# Patient Record
Sex: Male | Born: 1980
Health system: Southern US, Community
[De-identification: ages and names within clinical notes are randomized; demographics above are authoritative.]

## PROBLEM LIST (undated history)

## (undated) DIAGNOSIS — E119 Type 2 diabetes mellitus without complications: Secondary | ICD-10-CM

---

## 2005-09-16 ENCOUNTER — Emergency Department (HOSPITAL_COMMUNITY): Admission: EM | Admit: 2005-09-16 | Discharge: 2005-09-16 | Payer: Self-pay | Admitting: Emergency Medicine

## 2010-01-07 ENCOUNTER — Emergency Department (HOSPITAL_COMMUNITY)
Admission: EM | Admit: 2010-01-07 | Discharge: 2010-01-07 | Payer: Self-pay | Source: Home / Self Care | Admitting: Family Medicine

## 2010-02-23 ENCOUNTER — Emergency Department (HOSPITAL_COMMUNITY)
Admission: EM | Admit: 2010-02-23 | Discharge: 2010-02-23 | Payer: Self-pay | Source: Home / Self Care | Admitting: Emergency Medicine

## 2010-03-08 ENCOUNTER — Emergency Department (HOSPITAL_COMMUNITY)
Admission: EM | Admit: 2010-03-08 | Discharge: 2010-03-08 | Disposition: A | Discharge status: Home / Self Care | Payer: Self-pay | Attending: Emergency Medicine | Admitting: Emergency Medicine

## 2010-03-08 DIAGNOSIS — H5789 Other specified disorders of eye and adnexa: Secondary | ICD-10-CM | POA: Insufficient documentation

## 2010-03-08 DIAGNOSIS — H11419 Vascular abnormalities of conjunctiva, unspecified eye: Secondary | ICD-10-CM | POA: Insufficient documentation

## 2010-03-08 DIAGNOSIS — R220 Localized swelling, mass and lump, head: Secondary | ICD-10-CM | POA: Insufficient documentation

## 2010-03-08 DIAGNOSIS — H0019 Chalazion unspecified eye, unspecified eyelid: Secondary | ICD-10-CM | POA: Insufficient documentation

## 2010-03-08 DIAGNOSIS — K122 Cellulitis and abscess of mouth: Secondary | ICD-10-CM | POA: Insufficient documentation

## 2010-03-08 DIAGNOSIS — R51 Headache: Secondary | ICD-10-CM | POA: Insufficient documentation

## 2010-03-08 DIAGNOSIS — R221 Localized swelling, mass and lump, neck: Secondary | ICD-10-CM | POA: Insufficient documentation

## 2010-04-10 LAB — CULTURE, ROUTINE-ABSCESS

## 2010-09-02 ENCOUNTER — Emergency Department (HOSPITAL_COMMUNITY)
Admission: EM | Admit: 2010-09-02 | Discharge: 2010-09-02 | Disposition: A | Discharge status: Home / Self Care | Payer: Self-pay | Attending: Emergency Medicine | Admitting: Emergency Medicine

## 2010-09-02 DIAGNOSIS — H00019 Hordeolum externum unspecified eye, unspecified eyelid: Secondary | ICD-10-CM | POA: Insufficient documentation

## 2010-09-02 DIAGNOSIS — F17200 Nicotine dependence, unspecified, uncomplicated: Secondary | ICD-10-CM | POA: Insufficient documentation

## 2010-09-02 DIAGNOSIS — H5710 Ocular pain, unspecified eye: Secondary | ICD-10-CM | POA: Insufficient documentation

## 2010-09-04 LAB — WOUND CULTURE

## 2010-12-29 ENCOUNTER — Encounter: Payer: Self-pay | Admitting: *Deleted

## 2010-12-29 ENCOUNTER — Emergency Department (HOSPITAL_COMMUNITY)
Admission: EM | Admit: 2010-12-29 | Discharge: 2010-12-29 | Disposition: A | Discharge status: Home / Self Care | Payer: Self-pay | Attending: Emergency Medicine | Admitting: Emergency Medicine

## 2010-12-29 DIAGNOSIS — R220 Localized swelling, mass and lump, head: Secondary | ICD-10-CM | POA: Insufficient documentation

## 2010-12-29 DIAGNOSIS — K089 Disorder of teeth and supporting structures, unspecified: Secondary | ICD-10-CM | POA: Insufficient documentation

## 2010-12-29 DIAGNOSIS — K047 Periapical abscess without sinus: Secondary | ICD-10-CM | POA: Insufficient documentation

## 2010-12-29 DIAGNOSIS — K029 Dental caries, unspecified: Secondary | ICD-10-CM | POA: Insufficient documentation

## 2010-12-29 MED ORDER — OXYCODONE-ACETAMINOPHEN 5-325 MG PO TABS
1.0000 | ORAL_TABLET | Freq: Once | ORAL | Status: AC
  Administered 2010-12-29: 1 via ORAL
  Filled 2010-12-29: qty 1

## 2010-12-29 MED ORDER — OXYCODONE-ACETAMINOPHEN 5-325 MG PO TABS: 1.0000 | ORAL_TABLET | Freq: Four times a day (QID) | ORAL | Status: AC | PRN

## 2010-12-29 MED ORDER — PENICILLIN V POTASSIUM 500 MG PO TABS: 500.0000 mg | ORAL_TABLET | Freq: Four times a day (QID) | ORAL | Status: AC

## 2010-12-29 NOTE — ED Notes (Signed)
Pt has knot on the inside of his mouth.

## 2010-12-29 NOTE — ED Provider Notes (Signed)
History     CSN: [id number] Arrival date & time: 12/29/2010  2:07 PM   First MD Initiated Contact with Patient 12/29/10 1625      Chief Complaint  Patient presents with  . Dental Pain    (Consider location/radiation/quality/duration/timing/severity/associated sxs/prior treatment) Patient is a 30 y.o. male presenting with tooth pain. The history is provided by the patient.  Dental PainThe primary symptoms include mouth pain. Primary symptoms do not include dental injury, fever, shortness of breath, angioedema or cough. The symptoms began 3 to 5 days ago. The symptoms are worsening. The symptoms are new. The symptoms occur constantly.  Additional symptoms include: dental sensitivity to temperature, gum swelling, gum tenderness and facial swelling. Additional symptoms do not include: trismus, dry mouth and nosebleeds. Medical issues include: smoking.    History reviewed. No pertinent past medical history.  History reviewed. No pertinent past surgical history.  History reviewed. No pertinent family history.  History  Substance Use Topics  . Smoking status: Current Everyday Smoker -- 1.0 packs/day  . Smokeless tobacco: Not on file  . Alcohol Use: Yes      Review of Systems  Constitutional: Negative for fever.  HENT: Positive for facial swelling. Negative for nosebleeds.   Respiratory: Negative for cough and shortness of breath.   All other systems reviewed and are negative.    Allergies  Review of patient's allergies indicates no known allergies.  Home Medications   Current Outpatient Rx  Name Route Sig Dispense Refill  . GOODYS BODY PAIN PO Oral Take 1 packet by mouth every 6 (six) hours as needed. pain     . PRESCRIPTION MEDICATION Oral Take 1 tablet by mouth 4 (four) times daily as needed. Pain. Unknown medication doesn't remember what pharmacy he had it filled with.  Knows it has Tylenol in it.  Thanks Pharmacy       BP 124/92  Pulse 99  Temp(Src) 98.4 F (36.9  C) (Oral)  Resp 19  SpO2 98%  Physical Exam  Nursing note and vitals reviewed. Constitutional: He is oriented to person, place, and time. He appears well-developed and well-nourished. He appears distressed.  HENT:  Head: Normocephalic and atraumatic. No trismus in the jaw.  Mouth/Throat: Dental abscesses and dental caries present. No uvula swelling.         Left lower jaw swelling  Eyes: EOM are normal. Pupils are equal, round, and reactive to light.  Neck: Normal range of motion. Neck supple.  Neurological: He is alert and oriented to person, place, and time.  Skin: Skin is warm and dry.    ED Course  Procedures (including critical care time)  Labs Reviewed - No data to display No results found.  INCISION AND DRAINAGE Performed by: Gwyneth Sprout Consent: Verbal consent obtained. Risks and benefits: risks, benefits and alternatives were discussed Type: abscess  Body area: Mouth  Anesthesia: local infiltration  Local anesthetic: Bupivacaine 0.5%   Anesthetic total: 2 ml  Complexity: Simple   Drainage: purulent  Drainage amount: 2 mL   Patient tolerance: Patient tolerated the procedure well with no immediate complications.     No diagnosis found.    MDM   Pt with dental caries and facial swelling. Dental abscess present. No signs of ludwig's angina or difficulty swallowing and no systemic symptoms.  Area known and a moderate amount of pus drained. Will treat with PCN and have pt f/u with dentist.         Gwyneth Sprout, MD 12/29/10 1745

## 2012-05-15 ENCOUNTER — Emergency Department (HOSPITAL_COMMUNITY)
Admission: EM | Admit: 2012-05-15 | Discharge: 2012-05-15 | Disposition: A | Discharge status: Home / Self Care | Payer: Self-pay | Attending: Emergency Medicine | Admitting: Emergency Medicine

## 2012-05-15 DIAGNOSIS — K029 Dental caries, unspecified: Secondary | ICD-10-CM | POA: Insufficient documentation

## 2012-05-15 DIAGNOSIS — F17200 Nicotine dependence, unspecified, uncomplicated: Secondary | ICD-10-CM | POA: Insufficient documentation

## 2012-05-15 DIAGNOSIS — R220 Localized swelling, mass and lump, head: Secondary | ICD-10-CM | POA: Insufficient documentation

## 2012-05-15 DIAGNOSIS — K047 Periapical abscess without sinus: Secondary | ICD-10-CM

## 2012-05-15 MED ORDER — CLINDAMYCIN HCL 150 MG PO CAPS: 150.0000 mg | ORAL_CAPSULE | Freq: Four times a day (QID) | ORAL | Status: DC

## 2012-05-15 MED ORDER — HYDROCODONE-ACETAMINOPHEN 5-325 MG PO TABS: 1.0000 | ORAL_TABLET | ORAL | Status: DC | PRN

## 2012-05-15 NOTE — ED Provider Notes (Signed)
History     CSN: [id number]  Arrival date & time 05/15/12  0902   First MD Initiated Contact with Patient 05/15/12 [phone]      Chief Complaint  Patient presents with  . Dental Pain    (Consider location/radiation/quality/duration/timing/severity/associated sxs/prior treatment) HPI  32 year old male presents complaining of dental pain. Pain is located to left lower jaw for the past 2 days, 10/10, nonradiating, sharp and throbbing, persistent, with associate facial swelling. States he has several missing teeth to the affected area but that is due to dental decay.  Denies fever, chills, ear pain, sore throat, neck pain, trouble breathing, chest pain shortness of breath, or rash. He denies any recent trauma. He has tried taking over-the-counter medication including Tylenol, aspirin, warm compress with minimal relief. Does not have a dentist.  No past medical history on file.  No past surgical history on file.  No family history on file.  History  Substance Use Topics  . Smoking status: Current Every Day Smoker -- 1.00 packs/day  . Smokeless tobacco: Not on file  . Alcohol Use: Yes      Review of Systems  Constitutional:       A complete 10 system review of systems was obtained and all systems are negative except as noted in the HPI and PMH.    Allergies  Review of patient's allergies indicates no known allergies.  Home Medications   Current Outpatient Rx  Name  Route  Sig  Dispense  Refill  . BAYER ASPIRIN PO   Oral   Take 2 capsules by mouth daily as needed (for pain.).           There were no vitals taken for this visit.  Physical Exam  Nursing note and vitals reviewed. Constitutional: He appears well-developed and well-nourished. No distress.  HENT:  Head: Atraumatic.  Right Ear: External ear normal.  Left Ear: External ear normal.  Mouth/Throat: Oropharynx is clear and moist.    Eyes: Conjunctivae are normal.  Neck: Normal range of motion. Neck supple.   Lymphadenopathy:    He has no cervical adenopathy.  Neurological: He is alert.  Skin: Skin is warm. No rash noted.    ED Course  Procedures (including critical care time)  9:31 AM Patient with significant dental decay and associated gingival swelling suggestive of periapical abscess not amenable to drainage in the ED. No obvious trauma no trismus no other concerning factor.  Labs Reviewed - No data to display No results found.   1. Periapical abscess with facial involvement       MDM  BP 153/97  Pulse 76  Temp(Src) 98.1 F (36.7 C) (Oral)  Resp 20  SpO2 97%  I have reviewed nursing notes and vital signs.  I reviewed available ER/hospitalization records thought the EMR         Fayrene Helper, [place] 05/16/12 8657

## 2012-05-15 NOTE — ED Notes (Signed)
Pt has left lower dental and gum pain.  Pt reports 10/10 pain and swelling to lower jaw.  RN notes redness and swelling. Pt is missing teeth on left rear of jaw, states they wore down and he hasnt seen a dentist.  Pt alert oriented X4

## 2012-05-19 NOTE — ED Provider Notes (Signed)
Medical screening examination/treatment/procedure(s) were performed by non-physician practitioner and as supervising physician I was immediately available for consultation/collaboration.    Virgle Arth L Zahari Xiang, MD 05/19/12 0708 

## 2013-08-13 ENCOUNTER — Encounter (HOSPITAL_COMMUNITY): Payer: Self-pay | Admitting: Emergency Medicine

## 2013-08-13 ENCOUNTER — Inpatient Hospital Stay (HOSPITAL_COMMUNITY)
Admission: AD | Admit: 2013-08-13 | Discharge: 2013-08-20 | DRG: 885 | Disposition: A | Discharge status: Home / Self Care | Payer: No Typology Code available for payment source | Source: Intra-hospital | Attending: Psychiatry | Admitting: Psychiatry

## 2013-08-13 ENCOUNTER — Encounter (HOSPITAL_COMMUNITY): Payer: Self-pay | Admitting: *Deleted

## 2013-08-13 ENCOUNTER — Emergency Department (HOSPITAL_COMMUNITY)
Admission: EM | Admit: 2013-08-13 | Discharge: 2013-08-13 | Disposition: A | Discharge status: Other Acute Inpatient Hospital | Payer: Federal, State, Local not specified - Other | Attending: Emergency Medicine | Admitting: Emergency Medicine

## 2013-08-13 DIAGNOSIS — E119 Type 2 diabetes mellitus without complications: Secondary | ICD-10-CM | POA: Diagnosis present

## 2013-08-13 DIAGNOSIS — Z598 Other problems related to housing and economic circumstances: Secondary | ICD-10-CM | POA: Diagnosis not present

## 2013-08-13 DIAGNOSIS — Z5987 Material hardship due to limited financial resources, not elsewhere classified: Secondary | ICD-10-CM

## 2013-08-13 DIAGNOSIS — R45851 Suicidal ideations: Secondary | ICD-10-CM

## 2013-08-13 DIAGNOSIS — Z5989 Other problems related to housing and economic circumstances: Secondary | ICD-10-CM | POA: Diagnosis not present

## 2013-08-13 DIAGNOSIS — F32A Depression, unspecified: Secondary | ICD-10-CM

## 2013-08-13 DIAGNOSIS — Z609 Problem related to social environment, unspecified: Secondary | ICD-10-CM

## 2013-08-13 DIAGNOSIS — F329 Major depressive disorder, single episode, unspecified: Secondary | ICD-10-CM | POA: Insufficient documentation

## 2013-08-13 DIAGNOSIS — F17200 Nicotine dependence, unspecified, uncomplicated: Secondary | ICD-10-CM | POA: Diagnosis present

## 2013-08-13 DIAGNOSIS — F332 Major depressive disorder, recurrent severe without psychotic features: Secondary | ICD-10-CM | POA: Diagnosis present

## 2013-08-13 DIAGNOSIS — F3289 Other specified depressive episodes: Secondary | ICD-10-CM | POA: Insufficient documentation

## 2013-08-13 DIAGNOSIS — F411 Generalized anxiety disorder: Secondary | ICD-10-CM | POA: Diagnosis present

## 2013-08-13 DIAGNOSIS — IMO0001 Reserved for inherently not codable concepts without codable children: Secondary | ICD-10-CM

## 2013-08-13 DIAGNOSIS — Z833 Family history of diabetes mellitus: Secondary | ICD-10-CM

## 2013-08-13 DIAGNOSIS — Z7982 Long term (current) use of aspirin: Secondary | ICD-10-CM | POA: Insufficient documentation

## 2013-08-13 DIAGNOSIS — E1165 Type 2 diabetes mellitus with hyperglycemia: Secondary | ICD-10-CM

## 2013-08-13 DIAGNOSIS — R739 Hyperglycemia, unspecified: Secondary | ICD-10-CM

## 2013-08-13 HISTORY — DX: Suicidal ideations: R45.851

## 2013-08-13 HISTORY — DX: Major depressive disorder, recurrent severe without psychotic features: F33.2

## 2013-08-13 LAB — SALICYLATE LEVEL: Salicylate Lvl: <2 mg/dL — ABNORMAL LOW (ref 2.8–20.0)

## 2013-08-13 LAB — COMPREHENSIVE METABOLIC PANEL
ALBUMIN: 3.8 g/dL (ref 3.5–5.2)
ALT: 11 U/L (ref 0–53)
AST: 17 U/L (ref 0–37)
Alkaline Phosphatase: 102 U/L (ref 39–117)
Anion gap: 12 (ref 5–15)
BILIRUBIN TOTAL: 0.3 mg/dL (ref 0.3–1.2)
BUN: 13 mg/dL (ref 6–23)
CHLORIDE: 97 meq/L (ref 96–112)
CO2: 28 meq/L (ref 19–32)
CREATININE: 0.77 mg/dL (ref 0.50–1.35)
Calcium: 9.5 mg/dL (ref 8.4–10.5)
GFR calc Af Amer: >90 mL/min (ref >90)
Glucose, Bld: 271 mg/dL — ABNORMAL HIGH (ref 70–99)
POTASSIUM: 4.4 meq/L (ref 3.7–5.3)
SODIUM: 137 meq/L (ref 137–147)
Total Protein: 7.7 g/dL (ref 6.0–8.3)

## 2013-08-13 LAB — CBC
HCT: 42.9 % (ref 39.0–52.0)
Hemoglobin: 14.9 g/dL (ref 13.0–17.0)
MCH: 30.7 pg (ref 26.0–34.0)
MCHC: 34.7 g/dL (ref 30.0–36.0)
MCV: 88.3 fL (ref 78.0–100.0)
PLATELETS: 222 10*3/uL (ref 150–400)
RBC: 4.86 MIL/uL (ref 4.22–5.81)
RDW: 12.3 % (ref 11.5–15.5)
WBC: 5.7 10*3/uL (ref 4.0–10.5)

## 2013-08-13 LAB — RAPID URINE DRUG SCREEN, HOSP PERFORMED
Amphetamines: NOT DETECTED
BARBITURATES: NOT DETECTED
BENZODIAZEPINES: NOT DETECTED
COCAINE: NOT DETECTED
OPIATES: NOT DETECTED
TETRAHYDROCANNABINOL: NOT DETECTED

## 2013-08-13 LAB — ACETAMINOPHEN LEVEL: Acetaminophen (Tylenol), Serum: <15 ug/mL (ref 10–30)

## 2013-08-13 LAB — GLUCOSE, CAPILLARY: Glucose-Capillary: 295 mg/dL — ABNORMAL HIGH (ref 70–99)

## 2013-08-13 LAB — ETHANOL

## 2013-08-13 MED ORDER — ACETAMINOPHEN 325 MG PO TABS
650.0000 mg | ORAL_TABLET | Freq: Four times a day (QID) | ORAL | Status: DC | PRN
  Administered 2013-08-15: 650 mg via ORAL

## 2013-08-13 MED ORDER — LORAZEPAM 1 MG PO TABS: 1.0000 mg | ORAL_TABLET | Freq: Three times a day (TID) | ORAL | Status: DC | PRN

## 2013-08-13 MED ORDER — TRAZODONE HCL 100 MG PO TABS
100.0000 mg | ORAL_TABLET | Freq: Every evening | ORAL | Status: DC | PRN
  Administered 2013-08-13 – 2013-08-18 (×4): 100 mg via ORAL
  Filled 2013-08-13: qty 14
  Filled 2013-08-13 (×6): qty 1

## 2013-08-13 MED ORDER — ALUM & MAG HYDROXIDE-SIMETH 200-200-20 MG/5ML PO SUSP: 30.0000 mL | ORAL | Status: DC | PRN

## 2013-08-13 MED ORDER — INSULIN ASPART 100 UNIT/ML ~~LOC~~ SOLN
0.0000 [IU] | Freq: Three times a day (TID) | SUBCUTANEOUS | Status: DC
  Administered 2013-08-13: 8 [IU] via SUBCUTANEOUS
  Administered 2013-08-14: 3 [IU] via SUBCUTANEOUS
  Administered 2013-08-14: 5 [IU] via SUBCUTANEOUS
  Administered 2013-08-14: 3 [IU] via SUBCUTANEOUS
  Administered 2013-08-15: 2 [IU] via SUBCUTANEOUS
  Administered 2013-08-15: 5 [IU] via SUBCUTANEOUS
  Administered 2013-08-15 – 2013-08-16 (×2): 3 [IU] via SUBCUTANEOUS
  Administered 2013-08-16: 2 [IU] via SUBCUTANEOUS
  Administered 2013-08-16 – 2013-08-17 (×2): 3 [IU] via SUBCUTANEOUS
  Administered 2013-08-17: 2 [IU] via SUBCUTANEOUS
  Administered 2013-08-17: 3 [IU] via SUBCUTANEOUS
  Administered 2013-08-18: 2 [IU] via SUBCUTANEOUS
  Administered 2013-08-18: 5 [IU] via SUBCUTANEOUS
  Administered 2013-08-19: 2 [IU] via SUBCUTANEOUS
  Administered 2013-08-19: 3 [IU] via SUBCUTANEOUS
  Administered 2013-08-20 (×2): 2 [IU] via SUBCUTANEOUS

## 2013-08-13 MED ORDER — MAGNESIUM HYDROXIDE 400 MG/5ML PO SUSP: 30.0000 mL | Freq: Every day | ORAL | Status: DC | PRN

## 2013-08-13 MED ORDER — ZOLPIDEM TARTRATE 5 MG PO TABS: 5.0000 mg | ORAL_TABLET | Freq: Every evening | ORAL | Status: DC | PRN

## 2013-08-13 MED ORDER — ACETAMINOPHEN 325 MG PO TABS
650.0000 mg | ORAL_TABLET | ORAL | Status: DC | PRN
  Filled 2013-08-13: qty 2

## 2013-08-13 MED ORDER — INSULIN ASPART 100 UNIT/ML ~~LOC~~ SOLN: 0.0000 [IU] | Freq: Three times a day (TID) | SUBCUTANEOUS | Status: DC

## 2013-08-13 MED ORDER — IBUPROFEN 600 MG PO TABS: 600.0000 mg | ORAL_TABLET | Freq: Three times a day (TID) | ORAL | Status: DC | PRN

## 2013-08-13 MED ORDER — SERTRALINE HCL 25 MG PO TABS
25.0000 mg | ORAL_TABLET | Freq: Every day | ORAL | Status: DC
  Administered 2013-08-13 – 2013-08-14 (×2): 25 mg via ORAL
  Filled 2013-08-13 (×5): qty 1

## 2013-08-13 MED ORDER — IBUPROFEN 200 MG PO TABS
600.0000 mg | ORAL_TABLET | Freq: Three times a day (TID) | ORAL | Status: DC | PRN
Start: 2013-08-13 — End: 2013-08-13

## 2013-08-13 MED ORDER — ACETAMINOPHEN 325 MG PO TABS: 650.0000 mg | ORAL_TABLET | ORAL | Status: DC | PRN

## 2013-08-13 MED ORDER — HALOPERIDOL LACTATE 5 MG/ML IJ SOLN
INTRAMUSCULAR | Status: AC
  Filled 2013-08-13: qty 1

## 2013-08-13 MED ORDER — ASPIRIN 81 MG PO CHEW
81.0000 mg | CHEWABLE_TABLET | Freq: Every day | ORAL | Status: DC
  Administered 2013-08-13 – 2013-08-20 (×8): 81 mg via ORAL
  Filled 2013-08-13 (×11): qty 1

## 2013-08-13 MED ORDER — ONDANSETRON HCL 4 MG PO TABS: 4.0000 mg | ORAL_TABLET | Freq: Three times a day (TID) | ORAL | Status: DC | PRN

## 2013-08-13 NOTE — ED Notes (Signed)
Per pt, has hx of depression.  [facility]  Pt did not take meds.  Lives with family.  Driving today with uncle and made statements of hurting self.  No plan.  Denies attempt.

## 2013-08-13 NOTE — ED Provider Notes (Signed)
Medical screening examination/treatment/procedure(s) were performed by non-physician practitioner and as supervising physician I was immediately available for consultation/collaboration.    Linwood DibblesJon Seriyah Collison, MD 08/13/13 [phone]

## 2013-08-13 NOTE — Progress Notes (Signed)
Patient ID: Lawrence Mccormick, male   DOB: 06/01/1980, 33 y.o.   MRN: [id number] Nursing Admit    [facility] this 33 yearold african [place] male, single, denies substance abuse, who is endorsing fleeting SI, states he has " come here for help" and pretty much this is all he will divulge to this Clinical research associatewriter. HE denies known drug and / or food allergies, PMH, previous hospitalization. Says he " lives in the area", is single, " does odd jobs" . When pt's search is completed, ankle bracelt for house arrest discovered by this writer and he states he' s had it on " awhile". He reprots having increased feeling sof depression recently and that last night he " had a hard time" because " I wanted to kill myslef and couldn't think of a reason to keep living". At this point, when pressed by this writer, this patient does agree not to hurt himslef and to communicate with this nurse if his feelings change. After admission assessment is completed, he is oriented to unit and served his dinner. POC initiated.

## 2013-08-13 NOTE — ED Notes (Signed)
[facility]. Patient currently denies SI/HI and auditory and visual hallucinations but admits to having "on and off thoughts" about harming himself. Patient has no active plan and verbally agrees to contract for safety. Patient given meal and drink. Patient has no questions or concerns at this time. Will continue to monitor patient for safety.

## 2013-08-13 NOTE — Progress Notes (Signed)
D:  Passive SI-contracts for safety.Pt denies HI/AVH. Pt is pleasant and cooperative. Pt forwards little, but stated he  was in prison from 1999-2005 for felony kidnapping, but pt would not go into details. Pt stated he went back in Dec 2005 for f months. Pt was vague on why he was wearing the ankle bracelet, but sounded like a possible probation violation, but pt has had the bracelet for 2 months.   A: Pt was offered support and encouragement. Pt was given scheduled medications. Pt was encourage to attend groups. Q 15 minute checks were done for safety.   R:Pt attends groups and interacts well with peers and staff. Pt is taking medication. Pt has no complaints at this time.Pt receptive to treatment and safety maintained on unit.

## 2013-08-13 NOTE — Progress Notes (Signed)
Psychoeducational Group Note  Date:  08/13/2013 Time:  2103  Group Topic/Focus:  Wrap-Up Group:   The focus of this group is to help patients review their daily goal of treatment and discuss progress on daily workbooks.  Participation Level: Did Not Attend  Participation Quality:  Not Applicable  Affect:  Not Applicable  Cognitive:  Not Applicable  Insight:  Not Applicable  Engagement in Group: Not Applicable  Additional Comments:  The patient did not attend group since he was either in the hallway or in his bedroom at that time.   [name], Jaxtin Raimondo S 08/13/2013, 9:04 PM
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
## 2013-08-13 NOTE — ED Provider Notes (Signed)
CSN: [id number]     Arrival date & time 08/13/13  1303 History  This chart was scribed for Lawrence Beck, PA, working with Linwood Dibbles, MD by Chestine Spore, ED Scribe. The patient was seen in room WTR5/WTR5 at 1:50 PM.     Chief Complaint  Patient presents with  . Depression  . Suicidal   HPI HPI Comments: Lawrence Mccormick is a 33 y.o. male who presents to the Emergency Department complaining of depression and suicidal. He states that he has an h/o depression. He states that he was given a Rx when he was in [facility] for depression. He states that he did not take the medications that were prescribed for him. He states that he was driving with his uncle today and he made statements of hurting hisself. He states that he does not currently have a plan for killing hisself. He denies any attempt made at this time.   He denies any other associated symptoms. He states that he lives with his family. He states that he feels as if life is not going his way. He states that daily stress is what makes him feel this way. He denies wanting to hurt anyone else. He states that he just does not want to be around anymore. He states that he does not do drugs, but he does drink beer once a week.   History reviewed. No pertinent past medical history. History reviewed. No pertinent past surgical history. History reviewed. No pertinent family history. History  Substance Use Topics  . Smoking status: Current Every Day Smoker -- 1.00 packs/day  . Smokeless tobacco: Not on file  . Alcohol Use: Yes     Comment: occ    Review of Systems  Psychiatric/Behavioral: Positive for suicidal ideas and dysphoric mood.  All other systems reviewed and are negative.     Allergies  Review of patient's allergies indicates no known allergies.  Home Medications   Prior to Admission medications   Medication Sig Start Date End Date Taking? Authorizing Provider  aspirin 81 MG tablet Take 162 mg by mouth daily as needed for pain.    Yes Historical Provider, MD   BP 123/75  Pulse 68  Temp(Src) 98.4 F (36.9 C) (Oral)  Resp 16  SpO2 99%  Physical Exam  Nursing note and vitals reviewed. Constitutional: He is oriented to person, place, and time. He appears well-developed and well-nourished. No distress.  HENT:  Head: Normocephalic and atraumatic.  Eyes: EOM are normal.  Neck: Neck supple. No tracheal deviation present.  Cardiovascular: Normal rate.   Pulmonary/Chest: Effort normal. No respiratory distress.  Musculoskeletal: Normal range of motion.  Neurological: He is alert and oriented to person, place, and time.  Skin: Skin is warm and dry.  Psychiatric:  Flat affect. Depressed mood.     ED Course  Procedures (including critical care time) DIAGNOSTIC STUDIES: Oxygen Saturation is 99% on room air, normal by my interpretation.    COORDINATION OF CARE: 1:53 PM-Discussed treatment plan which includes labs with pt at bedside and pt agreed to plan.   Labs Review Labs Reviewed  CBC  URINE RAPID DRUG SCREEN (HOSP PERFORMED)  COMPREHENSIVE METABOLIC PANEL  ETHANOL  ACETAMINOPHEN LEVEL  SALICYLATE LEVEL    Imaging Review No results found.   EKG Interpretation None      MDM   Final diagnoses:  Suicidal ideation  Depression    1:59 PM Labs pending. Vitals stable and patient.  I personally performed the services described in this  documentation, which was scribed in my presence. The recorded information has been reviewed and is accurate.    Lawrence BeckKaitlyn Bracy Pepper, PA-C 08/13/13 [address]1430  Roark Rufo, PA-C 08/13/13 1430

## 2013-08-13 NOTE — ED Notes (Signed)
Patient belongings, shoes, shirt, pants, wallet, cigaretss, gum and mints

## 2013-08-13 NOTE — Tx Team (Signed)
Initial Interdisciplinary Treatment Plan  PATIENT STRENGTHS: (choose at least two) Ability for insight Active sense of humor  PATIENT STRESSORS: Educational concerns Financial difficulties Health problems Legal issue Marital or family conflict   PROBLEM LIST: Problem List/Patient Goals Date to be addressed Date deferred Reason deferred Estimated date of resolution  Suiciadal Ideations 08/13/13     Depression due to SI 08/13/2013     Lack of family 08/13/2013                                          DISCHARGE CRITERIA:  Ability to meet basic life and health needs Adequate post-discharge living arrangements Improved stabilization in mood, thinking, and/or behavior  PRELIMINARY DISCHARGE PLAN: Attend aftercare/continuing care group Outpatient therapy Participate in family therapy  PATIENT/FAMIILY INVOLVEMENT: This treatment plan has been presented to and reviewed with the patient, Lawrence Mccormick, and/or family member,   The patient and family have been given the opportunity to ask questions and make suggestions.  Rich BraveDuke, Kortney Schoenfelder Lynn 08/13/2013, 7:10 PM

## 2013-08-13 NOTE — ED Notes (Signed)
[facility] Patient given education regarding transfer. Patient has no questions or concerns at this time. Patient states that "I am ready to receive help for my depression." The patient was given encouragement from RN before discharge. [facility]

## 2013-08-13 NOTE — Progress Notes (Signed)
[facility] provided pt with a [facility] [employer] Card application to help patient establish a pcp.  °

## 2013-08-13 NOTE — Progress Notes (Signed)
CSW was made aware by med tech in the ED that the patient had an ankle bracelet like someone on home monitoring on his ankle. She noticed it as the patient was walking out of the building and there were no notes to make anyone staff aware of the potential legal concerns.  CSW called and [facility] to make them aware and inform the social worker to investigate.      Maryelizabeth Rowanressa Issabela Lesko, MSW, [place], 08/13/2013 Evening Clinical Social Worker [phone]

## 2013-08-14 DIAGNOSIS — R45851 Suicidal ideations: Secondary | ICD-10-CM

## 2013-08-14 DIAGNOSIS — R7309 Other abnormal glucose: Secondary | ICD-10-CM

## 2013-08-14 DIAGNOSIS — R739 Hyperglycemia, unspecified: Secondary | ICD-10-CM | POA: Diagnosis present

## 2013-08-14 DIAGNOSIS — F332 Major depressive disorder, recurrent severe without psychotic features: Principal | ICD-10-CM

## 2013-08-14 LAB — URINALYSIS, ROUTINE W REFLEX MICROSCOPIC
Bilirubin Urine: NEGATIVE
GLUCOSE, UA: NEGATIVE mg/dL
Ketones, ur: NEGATIVE mg/dL
LEUKOCYTES UA: NEGATIVE
NITRITE: NEGATIVE
Protein, ur: 30 mg/dL — AB
Specific Gravity, Urine: 1.028 (ref 1.005–1.030)
Urobilinogen, UA: 0.2 mg/dL (ref 0.0–1.0)
pH: 6 (ref 5.0–8.0)

## 2013-08-14 LAB — BASIC METABOLIC PANEL
Anion gap: 11 (ref 5–15)
BUN: 15 mg/dL (ref 6–23)
CHLORIDE: 100 meq/L (ref 96–112)
CO2: 29 mEq/L (ref 19–32)
Calcium: 9.6 mg/dL (ref 8.4–10.5)
Creatinine, Ser: 1.06 mg/dL (ref 0.50–1.35)
GFR calc Af Amer: >90 mL/min (ref >90)
GFR calc non Af Amer: >90 mL/min (ref >90)
GLUCOSE: 202 mg/dL — AB (ref 70–99)
POTASSIUM: 4.5 meq/L (ref 3.7–5.3)
Sodium: 140 mEq/L (ref 137–147)

## 2013-08-14 LAB — GLUCOSE, CAPILLARY
Glucose-Capillary: 191 mg/dL — ABNORMAL HIGH (ref 70–99)
Glucose-Capillary: 227 mg/dL — ABNORMAL HIGH (ref 70–99)
Glucose-Capillary: 179 mg/dL — ABNORMAL HIGH (ref 70–99)

## 2013-08-14 LAB — URINE MICROSCOPIC-ADD ON

## 2013-08-14 MED ORDER — METFORMIN HCL 500 MG PO TABS
500.0000 mg | ORAL_TABLET | Freq: Two times a day (BID) | ORAL | Status: DC
Start: 2013-08-14 — End: 2013-08-15
  Administered 2013-08-14: 500 mg via ORAL
  Filled 2013-08-14 (×3): qty 1

## 2013-08-14 MED ORDER — SERTRALINE HCL 50 MG PO TABS
50.0000 mg | ORAL_TABLET | Freq: Every day | ORAL | Status: DC
  Administered 2013-08-15 – 2013-08-18 (×4): 50 mg via ORAL
  Filled 2013-08-14 (×6): qty 1

## 2013-08-14 NOTE — Progress Notes (Signed)
Psychoeducational Group Note  Date:  09/07/2011 Time:  1100 Group Topic/Focus:  Identifying Needs:   The focus of this group is to help patients identify their personal needs that have been historically problematic and identify healthy behaviors to address their needs.  Participation Level: Minimal   Participation Quality:Minimal   Affect Flat  :Cognitive:  Low   Insight:  Poor  Engagement in Group:

## 2013-08-14 NOTE — Consult Note (Signed)
[facility] Medical Consultation  Angela Coxorian J Wattley ZOX:[id number] DOB: 03/15/1980 DOA: 08/13/2013 PCP: No Pcp   Requesting physician: Dr. Jama Flavorsobos Date of consultation: 08/14/2013 Reason for consultation: Hyperglycemia versus questionable new onset diabetes mellitus  Impression/Recommendations Principal Problem:   Severe recurrent major depression without psychotic features Active Problems:   Hyperglycemia   #1 hyperglycemia/ ?? New onset diabetes mellitus Patient noted to be hyperglycemic with blood sugars ranging from 191-295. Patient with no prior history of diabetes. Patient does endorse a family history of diabetes in his father and grandfather. Patient denies any polyuria, no polydipsia, no polyphagia and no recent weight loss. CBC which was done on admission was within normal limits. Comprehensive metabolic profile done did have a glucose of 271 otherwise was within normal limits. Will check a hemoglobin A1c, check a UA with cultures and sensitivities, check a EKG, check a fasting lipid panel. Patient's blood pressure seems to be well-controlled with systolic blood pressures less than 130s. Will place patient on metformin 500 mg twice daily. Continue sliding scale insulin. Consult with diabetic coordinator. Patient will need yearly eye examination done as outpatient. Patient will need to a PCP to followup with as an outpatient for further management.  #2 severe recurrent patient depressive disorder Per primary team.   I will followup again tomorrow. Please contact me if I can be of assistance in the meanwhile. Thank you for this consultation.  Chief Complaint: Recurrent major depressive disorder  HPI:  Patient is a 33 year old African American gentleman with history of major depressive disorder who was admitted to behavioral health for recurrent major depressive disorder. It was noted the patient was hyperglycemic with blood sugars ranging from 191-295. We were consulted for  further evaluation of hyperglycemia versus new onset diabetes mellitus. Patient denies any prior history of diabetes however does endorse that his father and grandfather both are diabetics. Patient denies any fevers, no chills, no nausea, no vomiting, no chest pain, no shortness of breath, no polydipsia, no polyphagia, no recent weight loss, no polyuria. Patient denies any dysuria. No diarrhea, no constipation, no weakness, no cough, no melena, no hematemesis, no hematochezia.  Review of Systems:  As per GI otherwise negative.  History reviewed. No pertinent past medical history. History reviewed. No pertinent past surgical history. Social History:  reports that he has been smoking Cigarettes.  He has a 3.75 pack-year smoking history. He does not have any smokeless tobacco history on file. He reports that he drinks alcohol. He reports that he does not use illicit drugs.  No Known Allergies History reviewed. No pertinent family history.  Prior to Admission medications   Medication Sig Start Date End Date Taking? Authorizing Provider  aspirin 81 MG tablet Take 162 mg by mouth daily as needed for pain.    Historical Provider, MD   Physical Exam: Blood pressure 107/74, pulse 87, temperature 97.7 F (36.5 C), temperature source Oral, resp. rate 18, height 5\' 5"  (1.651 m), weight 82.101 kg (181 lb), SpO2 98.00%. Filed Vitals:   08/14/13 0933  BP: 107/74  Pulse: 87  Temp:   Resp:      General:  Well-developed well-nourished no acute cardiopulmonary distress.  Eyes: Pupils equal round and reactive to light and accommodation. Extraocular movements intact.  ENT: Oropharynx is clear, no lesions, no exudates.  Neck: Supple no lymphadenopathy. No JVD.  Cardiovascular: Regular rate rhythm no murmurs rubs or gallops. No lower extremity edema.  Respiratory: Clear to auscultation bilaterally. No wheezes, no crackles, no rhonchi.  Abdomen:  Soft, nontender, nondistended, positive bowel sounds,  no rebound, no guarding.  Skin: No rashes or lesions seen  Musculoskeletal: 5/5 bilateral upper extremity strength. 5/5 bilateral lower extremity strength.  Psychiatric: Depressed mood. Fair judgment. Fair insight.  Neurologic: Alert and oriented x3. Cranial nerves II through XII are grossly intact. No focal deficits.  Labs on Admission:  Basic Metabolic Panel:  Recent Labs Lab 08/13/13 1339  NA 137  K 4.4  CL 97  CO2 28  GLUCOSE 271*  BUN 13  CREATININE 0.77  CALCIUM 9.5   Liver Function Tests:  Recent Labs Lab 08/13/13 1339  AST 17  ALT 11  ALKPHOS 102  BILITOT 0.3  PROT 7.7  ALBUMIN 3.8   No results found for this basename: LIPASE, AMYLASE,  in the last 168 hours No results found for this basename: AMMONIA,  in the last 168 hours CBC:  Recent Labs Lab 08/13/13 1339  WBC 5.7  HGB 14.9  HCT 42.9  MCV 88.3  PLT 222   Cardiac Enzymes: No results found for this basename: CKTOTAL, CKMB, CKMBINDEX, TROPONINI,  in the last 168 hours BNP: No components found with this basename: POCBNP,  CBG:  Recent Labs Lab 08/13/13 2046 08/14/13 0626 08/14/13 1202  GLUCAP 295* 191* 227*    Radiological Exams on Admission: No results found.  EKG: None  Time spent: 65 mins  [facility] MD [facility] Pager [phone]  If 7PM-7AM, please contact night-coverage [email or web address] Password [facility] 08/14/2013, 3:57 PM

## 2013-08-14 NOTE — Progress Notes (Signed)
[facility] Group Notes:  (Nursing/MHT/Case Management/Adjunct)  Date:  08/14/2013  Time:  8:42 PM  Type of Therapy:  Psychoeducational Skills  Participation Level:  Active  Participation Quality:  Appropriate  Affect:  Flat  Cognitive:  Appropriate  Insight:  Appropriate  Engagement in Group:  Lacking  Modes of Intervention:  Education  Summary of Progress/Problems: The patient described his day as having been "interesting". He indicated that he went outside for fresh air and that his day was "productive" overall. In addition, he also stated that he is working on his housing situation. As a theme for the day, his support system will consist of his family and uncle.   [name], Lemonte Al S 08/14/2013, 8:42 PM

## 2013-08-14 NOTE — [facility] Suicide Risk Assessment (Signed)
   Nursing information obtained from:  Patient Demographic factors:  Male;Divorced or widowed;Low socioeconomic status Current Mental Status:  Suicidal ideation indicated by patient Loss Factors:  Decrease in vocational status Historical Factors:    Risk Reduction Factors:  Living with another person, especially a relative Total Time spent with patient: 45 minutes  CLINICAL FACTORS:   Depression:   Severe  Psychiatric Specialty Exam: Physical Exam  ROS  Blood pressure 107/74, pulse 87, temperature 97.7 F (36.5 C), temperature source Oral, resp. rate 18, height 5\' 5"  (1.651 m), weight 82.101 kg (181 lb), SpO2 98.00%.Body mass index is 30.12 kg/(m^2).  See Admit Note MSE  COGNITIVE FEATURES THAT CONTRIBUTE TO RISK:  Closed-mindedness    SUICIDE RISK:   Moderate:  Frequent suicidal ideation with limited intensity, and duration, some specificity in terms of plans, no associated intent, good self-control, limited dysphoria/symptomatology, some risk factors present, and identifiable protective factors, including available and accessible social support.  PLAN OF CARE:Patient will be admitted to inpatient psychiatric unit for stabilization and safety. Will provide and encourage milieu participation. Provide medication management and maked adjustments as needed.  Will follow daily.    I certify that inpatient services furnished can reasonably be expected to improve the patient's condition.  COBOS, FERNANDO 08/14/2013, 2:33 PM

## 2013-08-14 NOTE — [facility] Group Notes (Signed)
[facility] Group Notes:  (Nursing/MHT/Case Management/Adjunct)  Date:  08/14/2013  Time:  11:18 AM  Type of Therapy:  Psychoeducational Skills  Participation Level:  Active  Participation Quality:  Appropriate  Affect:  Appropriate  Cognitive:  Appropriate  Insight:  Appropriate  Engagement in Group:  Engaged  Modes of Intervention:  Discussion  Summary of Progress/Problems: Pt did attend self inventory group, pt reported that he was negative HI, no AH/VH noted. Pt did report being positive SI, however was able to contract for safety. Pt rated his depression as a 4, and his helplessness/hopelessness as a 0.     Pt reported concerns about his medication and needing to be on something to help with depression and racing thoughts, pt advised that the doctor will be made aware.   Jacquelyne BalintForrest, Monna Crean Shanta 08/14/2013, 11:18 AM
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
## 2013-08-14 NOTE — Progress Notes (Addendum)
D Brees is seen sitting in the group room. HE is coloring and working in a coloring book he found. HE takes his medications as planned. HE avoids eye contact. HE says he is experiencing suicidal ideations, but he easily contracts  For safety when he is asked .   A He cont to be evasive and non committal, when answering questions about his past and  his family situation. He answers questions on his morning assessment and these are as follows: denies SI within the past 24 hrs, rates his depression and hopelessness and anxiety "7/5/4" and he is concerned about where he will live when he is DC'd.   R Safety is in place and poc moves forward Addendum: [facility]ist from [facility] over to assess pt and following ordered: EKG, diabetes consult , Glucophage 500 mg PO BiD, urinalysis and C & S and BMEP with HgbA1c this evening at 1930. Pt educated and stated understanding.

## 2013-08-14 NOTE — [facility] Group Notes (Signed)
[facility] Group Notes:  (Clinical Social Work)  08/14/2013   1:15-2:15PM  Summary of Progress/Problems:   The main focus of today's process group was for the patient to identify ways in which they have sabotaged their own mental health wellness/recovery.  Motivational interviewing and a handout were used to explore the benefits and costs of their self-sabotaging behavior as well as the benefits and costs of changing this behavior.  The Stages of Change were explained to the group using a handout, and patients identified where they are with regard to changing self-defeating behaviors.  The patient expressed little in group, and was eventually called out to see the doctor.  Type of Therapy:  Process Group  Participation Level:  Active  Participation Quality:  Attentive  Affect:  Blunted and Resistant  Cognitive:  Alert  Insight:  Limited  Engagement in Therapy:  Limited  Modes of Intervention:  Education, Motivational Interviewing   Ambrose MantleMareida Grossman-Orr, LCSW 08/14/2013, 4:00pm
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
## 2013-08-15 ENCOUNTER — Other Ambulatory Visit: Payer: Self-pay

## 2013-08-15 DIAGNOSIS — IMO0001 Reserved for inherently not codable concepts without codable children: Secondary | ICD-10-CM

## 2013-08-15 DIAGNOSIS — E1165 Type 2 diabetes mellitus with hyperglycemia: Secondary | ICD-10-CM

## 2013-08-15 LAB — HEMOGLOBIN A1C
Hgb A1c MFr Bld: 9.3 % — ABNORMAL HIGH (ref <5.7)
Mean Plasma Glucose: 220 mg/dL — ABNORMAL HIGH (ref <117)

## 2013-08-15 LAB — GLUCOSE, CAPILLARY
GLUCOSE-CAPILLARY: 143 mg/dL — AB (ref 70–99)
Glucose-Capillary: 183 mg/dL — ABNORMAL HIGH (ref 70–99)
Glucose-Capillary: 202 mg/dL — ABNORMAL HIGH (ref 70–99)
Glucose-Capillary: 179 mg/dL — ABNORMAL HIGH (ref 70–99)
Glucose-Capillary: 192 mg/dL — ABNORMAL HIGH (ref 70–99)

## 2013-08-15 LAB — LIPID PANEL
CHOL/HDL RATIO: 3.4 ratio
Cholesterol: 182 mg/dL (ref 0–200)
HDL: 54 mg/dL (ref >39)
LDL CALC: 94 mg/dL (ref 0–99)
TRIGLYCERIDES: 172 mg/dL — AB (ref <150)
VLDL: 34 mg/dL (ref 0–40)

## 2013-08-15 MED ORDER — METFORMIN HCL 500 MG PO TABS
1000.0000 mg | ORAL_TABLET | Freq: Two times a day (BID) | ORAL | Status: DC
  Administered 2013-08-15 – 2013-08-20 (×11): 1000 mg via ORAL
  Filled 2013-08-15 (×6): qty 2
  Filled 2013-08-15: qty 56
  Filled 2013-08-15: qty 2
  Filled 2013-08-15 (×2): qty 56
  Filled 2013-08-15 (×3): qty 2
  Filled 2013-08-15: qty 56
  Filled 2013-08-15 (×3): qty 2

## 2013-08-15 MED ORDER — METFORMIN HCL 500 MG PO TABS
ORAL_TABLET | ORAL | Status: AC
  Filled 2013-08-15: qty 1

## 2013-08-15 NOTE — [facility] Group Notes (Signed)
[facility] Group Notes:  (Nursing/MHT/Case Management/Adjunct)  Date:  08/15/2013  Time:  10:49 AM  Type of Therapy:  Psychoeducational Skills  Participation Level:  Did Not Attend  Buford DresserForrest, Marguita Venning Shanta 08/15/2013, 10:49 AM

## 2013-08-15 NOTE — Progress Notes (Signed)
D Pt.  Denies SI and HI this pm.  A Writer offered support and encouragement.  R Pt. Discussed his diabetes diagnosis with Clinical research associatewriter we discussed his CBG of 192 this PM.  Pt. Now states he wants to live and does not want to loose his eye sight as a result of diabetes therefore wants to learn as much as he can to take care of self.  Pt. Has a nutrition consult scheduled.

## 2013-08-15 NOTE — Progress Notes (Signed)
[facility] Group Notes:  (Nursing/MHT/Case Management/Adjunct)  Date:  08/15/2013  Time:  9:28 PM  Type of Therapy:  Psychoeducational Skills  Participation Level:  Active  Participation Quality:  Appropriate  Affect:  Appropriate  Cognitive:  Appropriate  Insight:  Improving  Engagement in Group:  Improving  Modes of Intervention:  Education  Summary of Progress/Problems: The patient expressed in group that he was preoccupied today with his diagnosis of diabetes. He explained that he was unaware of this diagnosis up until yesterday and that he wants to live and remain healthy. In addition, he indicated that their is a history of diabetes in his family and that he is concerned. His goal for tomorrow is to speak with the dietician about his diabetes so that he can get some information about altering his diet.   [name], Lawrence Fehrenbach S 08/15/2013, 9:28 PM

## 2013-08-15 NOTE — [facility] Group Notes (Signed)
[facility] Group Notes:  (Clinical Social Work)  08/15/2013   1:15-2:15PM  Summary of Progress/Problems:  The main focus of today's process group was to   identify the patient's current support system and decide on other supports that can be put in place.  The picture on workbook was used to discuss why additional supports are needed.  An emphasis was placed on using counselor, doctor, therapy groups, 12-step groups, and problem-specific support groups to expand supports.   There was also an extensive discussion about what constitutes a healthy support versus an unhealthy support.  The patient expressed full comprehension of the concepts presented.  One current health support is his family, including his uncle who convinced him to come for treatment, his grandfather, his father, and an aunt.  Type of Therapy:  Process Group  Participation Level:  Active  Participation Quality:  Attentive and Sharing  Affect:  Blunted, Not Congruent  Cognitive:  Appropriate and Oriented  Insight:  Developing/Improving  Engagement in Therapy:  Improving  Modes of Intervention:  Education,  Support and Processing  Ambrose MantleMareida Grossman-Orr, LCSW 08/15/2013, 4:00pm

## 2013-08-15 NOTE — Progress Notes (Signed)
Psychoeducational Group Note  Date:  08/15/2013 Time: 1100 Group Topic/Focus:  Making Healthy Choices:   The focus of this group is to help patients identify negative/unhealthy choices they were using prior to admission and identify positive/healthier coping strategies to replace them upon discharge.  Participation Level:  Did Not Attend   Additional Comments:   Rich BraveDuke, Dustyn Armbrister Lynn 6:03 PM. 08/15/2013

## 2013-08-15 NOTE — Progress Notes (Signed)
D Langley Adieorian is seen out in the milieu this morning, ad lib, tolerating well. HE takes his morning meds as scheduled and he is interacting with other patients and the staff appropriately.    A He completed his morning assessment and on it he rates his depression, hopelessness and anxiety " 3/3/4", respectively,  he denies having SI within the past 24 hrs and he says  He will notify staff if his feelings change and he feels suicidal.   R POC cont, therapeutic relationship fostered and  DC plan  To be attended to.

## 2013-08-15 NOTE — Progress Notes (Signed)
Lawrence Mccormick Progress Note  08/15/2013 3:37 PM Lawrence Mccormick  MRN:  [id number] Subjective:  33 year old man, who states he came to the hospital at the prompting of an uncle " because I was kind of having crazy thoughts". States he has been feeling depressed and has been having thoughts of suicide, mostly passive thoughts such as " wishing I would die", " wishing not to be here". States he was also very angry with his mother, but at this time denies any homicidal ideations/violent ideations towards her. He was having some thoughts of overdosing. Patient states he has been " stressed out", and reports that he has a stormy, strained relatronship with his mother. States " when I visit her it starts good, but then she makes me feel like she hates me more than anyone".   Patient seen and chart reviewed. Torain states he feels okay, better than yesterday. "The way I feel about myself has changed. I am thinker." He reports active participation in group session, although he has states he has only been to two groups since being here. Group sessions have been proven to beneficial to him, helping and teaching him how to cope with certain things and become a better person. He is tolerating Zoloft well, denies any side effects at this.He notes that his mind wanders off after taking Zoloft.  Currently rates his depression 4/10, anxiety 6/10 and hopelessness 4/10. Pt also is bothered about his new diagnose of diabetes. He notes that diabetes runs in his family.     Diagnosis:   DSM5: Schizophrenia Disorders:   Obsessive-Compulsive Disorders:   Trauma-Stressor Disorders:   Substance/Addictive Disorders:   Depressive Disorders:  Major Depressive Disorder - Severe (296.23) Total Time spent with patient: 30 minutes  Axis I: Major Depression, Recurrent severe Axis II: Deferred Axis IV: economic problems, housing problems, problems related to legal system/crime, problems related to social environment, problems with  access to health care services and problems with primary support group Axis V: 41-50 serious symptoms  ADL's:  Intact  Sleep: Good  Appetite:  Good  Suicidal Ideation:  Plan:  Denies Intent:  Denies Means:  Denies Homicidal Ideation:  Plan:  Denies Intent:  Denies Means:  Denies AEB (as evidenced by):  Psychiatric Specialty Exam: Physical Exam  Review of Systems  Psychiatric/Behavioral: Positive for depression and suicidal ideas. Negative for hallucinations and substance abuse. The patient has insomnia.   All other systems reviewed and are negative.   Blood pressure 126/87, pulse 68, temperature 97.9 F (36.6 C), temperature source Oral, resp. rate 16, height 5' 5"  (1.651 m), weight 82.101 kg (181 lb), SpO2 98.00%.Body mass index is 30.12 kg/(m^2).  General Appearance: Fairly Groomed  Engineer, water::  Fair  Speech:  Clear and Coherent  Volume:  Normal  Mood:  Depressed  Affect:  Depressed and Flat  Thought Process:  Goal Directed and Intact  Orientation:  Full (Time, Place, and Person)  Thought Content:  WDL  Suicidal Thoughts:  No  Homicidal Thoughts:  No  Memory:  Immediate;   Good Recent;   Fair Remote;   Fair  Judgement:  Good  Insight:  Good  Psychomotor Activity:  Normal  Concentration:  Fair  Recall:  [name] of Knowledge:Fair  Language: Good  Akathisia:  No  Handed:  Right  AIMS (if indicated):     Assets:  Communication Skills Desire for Improvement Housing Social Support Vocational/Educational  Sleep:  Number of Hours: 5.25   Musculoskeletal: Strength &  Muscle Tone: within normal limits Gait & Station: normal Patient leans: N/A  Current Medications: Current Facility-Administered Medications  Medication Dose Route Frequency Provider Last Rate Last Dose  . acetaminophen (TYLENOL) tablet 650 mg  650 mg Oral Q4H PRN Waylan Boga, NP      . acetaminophen (TYLENOL) tablet 650 mg  650 mg Oral Q6H PRN Waylan Boga, NP   650 mg at 08/15/13 1027  .  alum & mag hydroxide-simeth (MAALOX/MYLANTA) 200-200-20 MG/5ML suspension 30 mL  30 mL Oral Q4H PRN Waylan Boga, NP      . aspirin chewable tablet 81 mg  81 mg Oral Daily Waylan Boga, NP   81 mg at 08/15/13 0743  . ibuprofen (ADVIL,MOTRIN) tablet 600 mg  600 mg Oral Q8H PRN Waylan Boga, NP      . insulin aspart (novoLOG) injection 0-15 Units  0-15 Units Subcutaneous TID WC Waylan Boga, NP   3 Units at 08/15/13 1215  . magnesium hydroxide (MILK OF MAGNESIA) suspension 30 mL  30 mL Oral Daily PRN Waylan Boga, NP      . metFORMIN (GLUCOPHAGE) 500 MG tablet           . metFORMIN (GLUCOPHAGE) tablet 1,000 mg  1,000 mg Oral BID WC Kelvin Cellar, Mccormick   1,000 mg at 08/15/13 0745  . sertraline (ZOLOFT) tablet 50 mg  50 mg Oral Daily Neita Garnet, Mccormick   50 mg at 08/15/13 0743  . traZODone (DESYREL) tablet 100 mg  100 mg Oral QHS PRN Waylan Boga, NP   100 mg at 08/13/13 2245    Lab Results:  Results for orders placed during the hospital encounter of 08/13/13 (from the past 48 hour(s))  GLUCOSE, CAPILLARY     Status: Abnormal   Collection Time    08/13/13  8:46 PM      Result Value Ref Range   Glucose-Capillary 295 (*) 70 - 99 mg/dL   Comment 1 Notify RN    GLUCOSE, CAPILLARY     Status: Abnormal   Collection Time    08/14/13  6:26 AM      Result Value Ref Range   Glucose-Capillary 191 (*) 70 - 99 mg/dL  GLUCOSE, CAPILLARY     Status: Abnormal   Collection Time    08/14/13 12:02 PM      Result Value Ref Range   Glucose-Capillary 227 (*) 70 - 99 mg/dL   Comment 1 Documented in Chart     Comment 2 Notify RN    URINALYSIS, ROUTINE W REFLEX MICROSCOPIC     Status: Abnormal   Collection Time    08/14/13  4:55 PM      Result Value Ref Range   Color, Urine YELLOW  YELLOW   APPearance CLEAR  CLEAR   Specific Gravity, Urine 1.028  1.005 - 1.030   pH 6.0  5.0 - 8.0   Glucose, UA NEGATIVE  NEGATIVE mg/dL   Hgb urine dipstick TRACE (*) NEGATIVE   Bilirubin Urine NEGATIVE  NEGATIVE    Ketones, ur NEGATIVE  NEGATIVE mg/dL   Protein, ur 30 (*) NEGATIVE mg/dL   Urobilinogen, UA 0.2  0.0 - 1.0 mg/dL   Nitrite NEGATIVE  NEGATIVE   Leukocytes, UA NEGATIVE  NEGATIVE   Comment: Performed at [place] ON     Status: Abnormal   Collection Time    08/14/13  4:55 PM      Result Value Ref Range   WBC, UA 0-2  <  3 WBC/hpf   RBC / HPF 0-2  <3 RBC/hpf   Crystals CA OXALATE CRYSTALS (*) NEGATIVE   Comment: Performed at [place], CAPILL[facility]     Status: Abnormal   Collection Time    08/14/13  4:59 PM      Result Value Ref Range   Glucose-Capillary 179 (*) 70 - 99 mg/dL  HEMOGLOBIN A1C     Status: Abnormal   Collection Time    08/14/13  7:20 PM      Result Value Ref Range   Hemoglobin A1C 9.3 (*) <5.7 %   Comment: (NOTE)                                                                               According to the ADA Clinical Practice Recommendations for 2011, when     HbA1c is used as a screening test:      >=6.5%   Diagnostic of Diabetes Mellitus               (if abnormal result is confirmed)     5.7-6.4%   Increased risk of developing Diabetes Mellitus     References:Diagnosis and Classification of Diabetes Mellitus,Diabetes     MMHW,8088,11(SRPRX 1):S62-S69 and Standards of Medical Care in             Diabetes - 2011,Diabetes Care,2011,34 (Suppl 1):S11-S61.   Mean Plasma Glucose 220 (*) <117 mg/dL   Comment: Performed at [place]     Status: Abnormal   Collection Time    08/14/13  7:20 PM      Result Value Ref Range   Sodium 140  137 - 147 mEq/L   Potassium 4.5  3.7 - 5.3 mEq/L   Chloride 100  96 - 112 mEq/L   CO2 29  19 - 32 mEq/L   Glucose, Bld 202 (*) 70 - 99 mg/dL   BUN 15  6 - 23 mg/dL   Creatinine, Ser 1.06  0.50 - 1.35 mg/dL   Calcium 9.6  8.4 - 10.5 mg/dL   GFR calc non Af Amer >90  >90 mL/min   GFR calc Af Amer >90  >90 mL/min   Comment: (NOTE)      The eGFR has been calculated using the CKD EPI equation.     This calculation has not been validated in all clinical situations.     eGFR's persistently <90 mL/min signify possible Chronic Kidney     Disease.   Anion gap 11  5 - 15   Comment: Performed at [place], CAP[facility]     Status: Abnormal   Collection Time    08/14/13  8:23 PM      Result Value Ref Range   Glucose-Capillary 183 (*) 70 - 99 mg/dL  GLUCOSE, CAPILLARY     Status: Abnormal   Collection Time    08/15/13  6:10 AM      Result Value Ref Range   Glucose-Capillary 202 (*) 70 - 99 mg/dL  LIPID PANEL     Status: Abnormal   Collection Time    08/15/13  6:21 AM  Result Value Ref Range   Cholesterol 182  0 - 200 mg/dL   Triglycerides 172 (*) <150 mg/dL   HDL 54  >39 mg/dL   Total CHOL/HDL Ratio 3.4     VLDL 34  0 - 40 mg/dL   LDL Cholesterol 94  0 - 99 mg/dL   Comment:            Total Cholesterol/HDL:CHD Risk     Coronary Heart Disease Risk Table                         Men   Women      1/2 Average Risk   3.4   3.3      Average Risk       5.0   4.4      2 X Average Risk   9.6   7.1      3 X Average Risk  23.4   11.0                Use the calculated Patient Ratio     above and the CHD Risk Table     to determine the patient's CHD Risk.                ATP III CLASSIFICATION (LDL):      <100     mg/dL   Optimal      100-129  mg/dL   Near or Above                        Optimal      130-159  mg/dL   Borderline      160-189  mg/dL   High      >190     mg/dL   Very High     Performed at [place], CAPILLARY     Status: Abnormal   Collection Time    08/15/13 11:50 AM      Result Value Ref Range   Glucose-Capillary 179 (*) 70 - 99 mg/dL    Physical Findings: AIMS: Facial and Oral Movements Muscles of Facial Expression: None, normal Lips and Perioral Area: None, normal Jaw: None, normal Tongue: None, normal,Extremity Movements Upper (arms, wrists,  hands, fingers): None, normal Lower (legs, knees, ankles, toes): None, normal, Trunk Movements Neck, shoulders, hips: None, normal, Overall Severity Severity of abnormal movements (highest score from questions above): None, normal Incapacitation due to abnormal movements: None, normal Patient's awareness of abnormal movements (rate only patient's report): No Awareness, Dental Status Current problems with teeth and/or dentures?: No  CIWA:  CIWA-Ar Total: 2 COWS:  COWS Total Score: 1  Treatment Plan Summary: Daily contact with patient to assess and evaluate symptoms and progress in treatment Medication management  Plan: Treatment Plan/Recommendations:  1 Admit for crisis management and stabilization. Estimated length of stay 5-7 days past his current stay of 1.  2 Individual and group therapy. 3 Medication management for depression, and anxiety to reduce current symptoms to base line and improve the overall levels of functioning: Medications reviewed with the patient and she stated no untoward effects, home medications in place.  4 Coping skills for depression and anxiety developing.  5 Continue crisis stabilization and management.  6 Address health issues- monitor vital signs, stable; POCT--endocrine consulted, orders placed.  7 Treatment plan in progress to prevent relapse prevention and self care.  8 Psychosocial education regarding relapse prevention and self  care 9 Heath care follow up as needed for any health concerns 10 Call for consult with hospitalist for additional specialty patient services as needed. Will need referral for PCP provider and medical services for uninsured patients. Theres is an increase risk for of depression in diabetic persons.  Medical Decision Making Problem Points:  Established problem, stable/improving (1), Review of last therapy session (1) and Review of psycho-social stressors (1) Data Points:  Review or order clinical lab tests (1) Review or order  medicine tests (1) Review and summation of old records (2) Review of medication regiment & side effects (2) Review of new medications or change in dosage (2)  I certify that inpatient services furnished can reasonably be expected to improve the patient's condition.   Priscille Loveless S FNP-BC 08/15/2013, 3:37 PM

## 2013-08-15 NOTE — Progress Notes (Signed)
[facility] PROGRESS NOTE  Angela Coxorian J Deveny ZOX:[id number] DOB: 12/05/1980 DOA: 08/13/2013 PCP: No Pcp  Assessment/Plan: 1. Type II Diabetes Mellitus.  -Newly diagnosed, Hg A1C 9.3 was started on Metformin 500 mg PO BID -Blood sugars remain elevated, having BS 179 today and 200's over night, will increase metformin to 1,000 BID.  -Follow blood sugars over the next 24 hours, will need close outpatient follow up   HPI/Subjective: Patient has no complaints, tolerating PO. He was started on Metformin 500 mg BID. Blood sugars reviewed.   Objective: Filed Vitals:   08/15/13 0807  BP: 126/87  Pulse:   Temp:   Resp:    No intake or output data in the 24 hours ending 08/15/13 1317 Filed Weights   08/13/13 1846  Weight: 82.101 kg (181 lb)    Exam:   General:  No acute distress awake and alert, following commands  Cardiovascular: Regular rate and rhythm normal S1S2  Respiratory: Clear to auscultation bilaterally, normal S1S2  Abdomen: Soft nontender nondistended  Musculoskeletal: No edema  Data Reviewed: Basic Metabolic Panel:  Recent Labs Lab 08/13/13 1339 08/14/13 1920  NA 137 140  K 4.4 4.5  CL 97 100  CO2 28 29  GLUCOSE 271* 202*  BUN 13 15  CREATININE 0.77 1.06  CALCIUM 9.5 9.6   Liver Function Tests:  Recent Labs Lab 08/13/13 1339  AST 17  ALT 11  ALKPHOS 102  BILITOT 0.3  PROT 7.7  ALBUMIN 3.8   No results found for this basename: LIPASE, AMYLASE,  in the last 168 hours No results found for this basename: AMMONIA,  in the last 168 hours CBC:  Recent Labs Lab 08/13/13 1339  WBC 5.7  HGB 14.9  HCT 42.9  MCV 88.3  PLT 222   Cardiac Enzymes: No results found for this basename: CKTOTAL, CKMB, CKMBINDEX, TROPONINI,  in the last 168 hours BNP (last 3 results) No results found for this basename: PROBNP,  in the last 8760 hours CBG:  Recent Labs Lab 08/14/13 1202 08/14/13 1659 08/14/13 2023 08/15/13 0610 08/15/13 1150  GLUCAP  227* 179* 183* 202* 179*    No results found for this or any previous visit (from the past 240 hour(s)).   Studies: No results found.  Scheduled Meds: . aspirin  81 mg Oral Daily  . insulin aspart  0-15 Units Subcutaneous TID WC  . metFORMIN      . metFORMIN  1,000 mg Oral BID WC  . sertraline  50 mg Oral Daily   Continuous Infusions:   Principal Problem:   Severe recurrent major depression without psychotic features Active Problems:   Hyperglycemia    Time spent: 15 min    Jeralyn BennettZAMORA, Bryden Darden  [facility] Pager [phone]. If 7PM-7AM, please contact night-coverage at [email or web address], password [email or web address] 08/15/2013, 1:17 PM  LOS: 2 days

## 2013-08-16 LAB — URINE CULTURE
Colony Count: NO GROWTH
Culture: NO GROWTH

## 2013-08-16 LAB — GLUCOSE, CAPILLARY
GLUCOSE-CAPILLARY: 155 mg/dL — AB (ref 70–99)
Glucose-Capillary: 178 mg/dL — ABNORMAL HIGH (ref 70–99)
Glucose-Capillary: 143 mg/dL — ABNORMAL HIGH (ref 70–99)

## 2013-08-16 MED ORDER — LIVING WELL WITH DIABETES BOOK
Freq: Once | Status: DC
  Filled 2013-08-16: qty 1

## 2013-08-16 NOTE — Progress Notes (Signed)
[facility] PROGRESS NOTE  Lawrence Mccormick ZDG:[id number] DOB: 02/07/1980 DOA: 08/13/2013 PCP: No Pcp  Assessment/Plan: 1. Type II Diabetes Mellitus.  -Newly diagnosed, Hg A1C 9.3 was started on Metformin 500 mg PO BID -Blood sugars improving. His metformin was increased to 1,000 mg PO BID yesterday -Would recommend discharging him on Metformin 1,000 mg PO BID. -Diabetic educator consultation -SW consult for assistance with home glucometer, strips, follow up appointment with PCP -Will sign off, please call with any questions.  Objective: Filed Vitals:   08/16/13 0701  BP: 118/64  Pulse: 91  Temp:   Resp:    No intake or output data in the 24 hours ending 08/16/13 1220 Filed Weights   08/13/13 1846  Weight: 82.101 kg (181 lb)    Data Reviewed: Basic Metabolic Panel:  Recent Labs Lab 08/13/13 1339 08/14/13 1920  NA 137 140  K 4.4 4.5  CL 97 100  CO2 28 29  GLUCOSE 271* 202*  BUN 13 15  CREATININE 0.77 1.06  CALCIUM 9.5 9.6   Liver Function Tests:  Recent Labs Lab 08/13/13 1339  AST 17  ALT 11  ALKPHOS 102  BILITOT 0.3  PROT 7.7  ALBUMIN 3.8   No results found for this basename: LIPASE, AMYLASE,  in the last 168 hours No results found for this basename: AMMONIA,  in the last 168 hours CBC:  Recent Labs Lab 08/13/13 1339  WBC 5.7  HGB 14.9  HCT 42.9  MCV 88.3  PLT 222   Cardiac Enzymes: No results found for this basename: CKTOTAL, CKMB, CKMBINDEX, TROPONINI,  in the last 168 hours BNP (last 3 results) No results found for this basename: PROBNP,  in the last 8760 hours CBG:  Recent Labs Lab 08/15/13 1150 08/15/13 1655 08/15/13 2031 08/16/13 0614 08/16/13 1152  GLUCAP 179* 143* 192* 178* 155*    Recent Results (from the past 240 hour(s))  URINE CULTURE     Status: None   Collection Time    08/14/13  4:55 PM      Result Value Ref Range Status   Specimen Description     Final   Value: URINE, CLEAN CATCH     [facility]formed [facility]   Special Requests     Final   Value: NONE     [facility]   Culture  Setup Time     Final   Value: 08/15/2013 11:36     Performed at [facility]   Colony Count     Final   Value: NO GROWTH     Performed at [facility]   Culture     Final   Value: NO GROWTH     Performed at [facility]   Report Status 08/16/2013 FINAL   Final     Studies: No results found.  Scheduled Meds: . aspirin  81 mg Oral Daily  . insulin aspart  0-15 Units Subcutaneous TID WC  . living well with diabetes book   Does not apply Once  . metFORMIN  1,000 mg Oral BID WC  . sertraline  50 mg Oral Daily   Continuous Infusions:   Principal Problem:   Severe recurrent major depression without psychotic features Active Problems:   Hyperglycemia    Time spent: 10 min    Jeralyn BennettZAMORA, [place]  [facility] Pager [phone]. If 7PM-7AM, please contact night-coverage at [email or web address], password [email or web address] 08/16/2013, 12:20 PM  LOS: 3 days

## 2013-08-16 NOTE — Progress Notes (Signed)
Patient ID: Lawrence Mccormick, male   DOB: 04/23/1980, 33 y.o.   MRN: [id number]   D: Pt has been very flat and depressed on the unit today. Pt reported that he was feeling much better, but needs more time since he was just told he was a diabetic. Pt reported that he is ready to learn about his condition and what he can and can not eat. Pt reported that he just would like for his life to get better, and it seems like it is getting better. Pt has attended all groups and has engaged in treatment. Pt has also taken all medications without any problems. Pt reported being negative SI/HI, no AH/VH noted. A: 15 min checks continued for patient safety. R: Pt safety maintained.

## 2013-08-16 NOTE — Progress Notes (Signed)
Patient ID: Lawrence Mccormick, male   DOB: 1981/01/02, 33 y.o.   MRN: [id number] [facility] MD Progress Note  08/16/2013 3:16 PM CLESTER CHLEBOWSKI  MRN:  [id number]  Subjective: Field says he was diagnosed with diabetes mellitus 3 days ago. The announcement of this disease surprised, and also made him more depressed. Says his life is not going well. Rates his depression at #5, however, endorses that his current antidepressant seem to be helping some. He denies any SIHI.   Diagnosis:   DSM5: Schizophrenia Disorders:   Obsessive-Compulsive Disorders:   Trauma-Stressor Disorders:   Substance/Addictive Disorders:   Depressive Disorders:  Major Depressive Disorder - Severe (296.23) Total Time spent with patient: 30 minutes  Axis I: Major Depression, Recurrent severe Axis II: Deferred Axis IV: economic problems, housing problems, problems related to legal system/crime, problems related to social environment, problems with access to health care services and problems with primary support group Axis V: 41-50 serious symptoms  ADL's:  Intact  Sleep: Good  Appetite:  Good  Suicidal Ideation:  Plan:  Denies Intent:  Denies Means:  Denies  Homicidal Ideation:  Plan:  Denies Intent:  Denies Means:  Denies AEB (as evidenced by):  Psychiatric Specialty Exam: Physical Exam  Review of Systems  Psychiatric/Behavioral: Positive for depression and suicidal ideas. Negative for hallucinations and substance abuse. The patient has insomnia.   All other systems reviewed and are negative.   Blood pressure 118/64, pulse 91, temperature 97.4 F (36.3 C), temperature source Oral, resp. rate 18, height _0  (1.651 m), weight 82.101 kg (181 lb), SpO2 98.00%.Body mass index is 30.12 kg/(m^2).  General Appearance: Fairly Groomed  Engineer, water::  Fair  Speech:  Clear and Coherent  Volume:  Normal  Mood:  Depressed  Affect:  Depressed and Flat  Thought Process:  Goal Directed and Intact  Orientation:  Full  (Time, Place, and Person)  Thought Content:  WDL  Suicidal Thoughts:  No  Homicidal Thoughts:  No  Memory:  Immediate;   Good Recent;   Fair Remote;   Fair  Judgement:  Good  Insight:  Good  Psychomotor Activity:  Normal  Concentration:  Fair  Recall:  [employer] of Knowledge:Fair  Language: Good  Akathisia:  No  Handed:  Right  AIMS (if indicated):     Assets:  Communication Skills Desire for Improvement Housing Social Support Vocational/Educational  Sleep:  Number of Hours: 5.75   Musculoskeletal: Strength & Muscle Tone: within normal limits Gait & Station: normal Patient leans: N/A  Current Medications: Current Facility-Administered Medications  Medication Dose Route Frequency Provider Last Rate Last Dose  . acetaminophen (TYLENOL) tablet 650 mg  650 mg Oral Q4H PRN Waylan Boga, NP      . acetaminophen (TYLENOL) tablet 650 mg  650 mg Oral Q6H PRN Waylan Boga, NP   650 mg at 08/15/13 1027  . alum & mag hydroxide-simeth (MAALOX/MYLANTA) 200-200-20 MG/5ML suspension 30 mL  30 mL Oral Q4H PRN Waylan Boga, NP      . aspirin chewable tablet 81 mg  81 mg Oral Daily Waylan Boga, NP   81 mg at 08/16/13 0725  . ibuprofen (ADVIL,MOTRIN) tablet 600 mg  600 mg Oral Q8H PRN Waylan Boga, NP      . insulin aspart (novoLOG) injection 0-15 Units  0-15 Units Subcutaneous TID WC Waylan Boga, NP   3 Units at 08/16/13 1202  . living well with diabetes book MISC   Does not apply Once Kelvin Cellar,  MD      . magnesium hydroxide (MILK OF MAGNESIA) suspension 30 mL  30 mL Oral Daily PRN Waylan Boga, NP      . metFORMIN (GLUCOPHAGE) tablet 1,000 mg  1,000 mg Oral BID WC Kelvin Cellar, MD   1,000 mg at 08/16/13 0725  . sertraline (ZOLOFT) tablet 50 mg  50 mg Oral Daily Neita Garnet, MD   50 mg at 08/16/13 0725  . traZODone (DESYREL) tablet 100 mg  100 mg Oral QHS PRN Waylan Boga, NP   100 mg at 08/15/13 2136    Lab Results:  Results for orders placed during the hospital encounter  of 08/13/13 (from the past 48 hour(s))  URINALYSIS, ROUTINE W REFLEX MICROSCOPIC     Status: Abnormal   Collection Time    08/14/13  4:55 PM      Result Value Ref Range   Color, Urine YELLOW  YELLOW   APPearance CLEAR  CLEAR   Specific Gravity, Urine 1.028  1.005 - 1.030   pH 6.0  5.0 - 8.0   Glucose, UA NEGATIVE  NEGATIVE mg/dL   Hgb urine dipstick TRACE (*) NEGATIVE   Bilirubin Urine NEGATIVE  NEGATIVE   Ketones, ur NEGATIVE  NEGATIVE mg/dL   Protein, ur 30 (*) NEGATIVE mg/dL   Urobilinogen, UA 0.2  0.0 - 1.0 mg/dL   Nitrite NEGATIVE  NEGATIVE   Leukocytes, UA NEGATIVE  NEGATIVE   Comment: Performed at [place]     Status: None   Collection Time    08/14/13  4:55 PM      Result Value Ref Range   Specimen Description       Value: URINE, CLEAN CATCH     Performed at [facility]   Special Requests       Value: NONE     Performed at [place] Time       Value: 08/15/2013 11:36     Performed at [place]       Value: NO GROWTH     Performed at [facility]   Culture       Value: NO GROWTH     Performed at [facility]   Report Status 08/16/2013 FINAL    URINE MICROSCOPIC-ADD ON     Status: Abnormal   Collection Time    08/14/13  4:55 PM      Result Value Ref Range   WBC, UA 0-2  <3 WBC/hpf   RBC / HPF 0-2  <3 RBC/hpf   Crystals CA OXALATE CRYSTALS (*) NEGATIVE   Comment: Performed at [place], [place]     Status: Abnormal   Collection Time    08/14/13  4:59 PM      Result Value Ref Range   Glucose-Capillary 179 (*) 70 - 99 mg/dL  HEMOGLOBIN A1C     Status: Abnormal   Collection Time    08/14/13  7:20 PM      Result Value Ref Range   Hemoglobin A1C 9.3 (*) <5.7 %   Comment: (NOTE)  According to the ADA Clinical Practice  Recommendations for 2011, when     HbA1c is used as a screening test:      >=6.5%   Diagnostic of Diabetes Mellitus               (if abnormal result is confirmed)     5.7-6.4%   Increased risk of developing Diabetes Mellitus     References:Diagnosis and Classification of Diabetes Mellitus,Diabetes     AJOI,7867,67(MCNOB 1):S62-S69 and Standards of Medical Care in             Diabetes - 2011,Diabetes SJGG,8366,29 (Suppl 1):S11-S61.   Mean Plasma Glucose 220 (*) <117 mg/dL   Comment: Performed at [place]     Status: Abnormal   Collection Time    08/14/13  7:20 PM      Result Value Ref Range   Sodium 140  137 - 147 mEq/L   Potassium 4.5  3.7 - 5.3 mEq/L   Chloride 100  96 - 112 mEq/L   CO2 29  19 - 32 mEq/L   Glucose, Bld 202 (*) 70 - 99 mg/dL   BUN 15  6 - 23 mg/dL   Creatinine, Ser 1.06  0.50 - 1.35 mg/dL   Calcium 9.6  8.4 - 10.5 mg/dL   GFR calc non Af Amer >90  >90 mL/min   GFR calc Af Amer >90  >90 mL/min   Comment: (NOTE)     The eGFR has been calculated using the CKD EPI equation.     This calculation has not been validated in all clinical situations.     eGFR's persistently <90 mL/min signify possible Chronic Kidney     Disease.   Anion gap 11  5 - 15   Comment: Performed at [place], [place]     Status: Abnormal   Collection Time    08/14/13  8:23 PM      Result Value Ref Range   Glucose-Capillary 183 (*) 70 - 99 mg/dL  GLUCOSE, CAPILLARY     Status: Abnormal   Collection Time    08/15/13  6:10 AM      Result Value Ref Range   Glucose-Capillary 202 (*) 70 - 99 mg/dL  LIPID PANEL     Status: Abnormal   Collection Time    08/15/13  6:21 AM      Result Value Ref Range   Cholesterol 182  0 - 200 mg/dL   Triglycerides 172 (*) <150 mg/dL   HDL 54  >39 mg/dL   Total CHOL/HDL Ratio 3.4     VLDL 34  0 - 40 mg/dL   LDL Cholesterol 94  0 - 99 mg/dL   Comment:            Total Cholesterol/HDL:CHD Risk      Coronary Heart Disease Risk Table                         Men   Women      1/2 Average Risk   3.4   3.3      Average Risk       5.0   4.4      2 X Average Risk   9.6   7.1      3 X Average Risk  23.4   11.0  Use the calculated Patient Ratio     above and the CHD Risk Table     to determine the patient's CHD Risk.                ATP III CLASSIFICATION (LDL):      <100     mg/dL   Optimal      100-129  mg/dL   Near or Above                        Optimal      130-159  mg/dL   Borderline      160-189  mg/dL   High      >190     mg/dL   Very High     Performed at [place], [place]     Status: Abnormal   Collection Time    08/15/13 11:50 AM      Result Value Ref Range   Glucose-Capillary 179 (*) 70 - 99 mg/dL  GLUCOSE, CAP[facility]     Status: Abnormal   Collection Time    08/15/13  4:55 PM      Result Value Ref Range   Glucose-Capillary 143 (*) 70 - 99 mg/dL   Comment 1 Documented in Chart     Comment 2 Notify RN    GLUCOSE, CAPILL[facility]     Status: Abnormal   Collection Time    08/15/13  8:31 PM      Result Value Ref Range   Glucose-Capillary 192 (*) 70 - 99 mg/dL   Comment 1 Documented in Chart    GLUCOSE, CAPILLARY     Status: Abnormal   Collection Time    08/16/13  6:14 AM      Result Value Ref Range   Glucose-Capillary 178 (*) 70 - 99 mg/dL   Comment 1 Documented in Chart    GLUCOSE, CAPILL[facility]     Status: Abnormal   Collection Time    08/16/13 11:52 AM      Result Value Ref Range   Glucose-Capillary 155 (*) 70 - 99 mg/dL   Comment 1 Notify RN      Physical Findings: AIMS: Facial and Oral Movements Muscles of Facial Expression: None, normal Lips and Perioral Area: None, normal Jaw: None, normal Tongue: None, normal,Extremity Movements Upper (arms, wrists, hands, fingers): None, normal Lower (legs, knees, ankles, toes): None, normal, Trunk Movements Neck, shoulders, hips: None, normal, Overall Severity Severity of abnormal  movements (highest score from questions above): None, normal Incapacitation due to abnormal movements: None, normal Patient's awareness of abnormal movements (rate only patient's report): No Awareness, Dental Status Current problems with teeth and/or dentures?: No Does patient usually wear dentures?: No  CIWA:  CIWA-Ar Total: 2 COWS:  COWS Total Score: 1  Treatment Plan Summary: Daily contact with patient to assess and evaluate symptoms and progress in treatment Medication management  1. Continue crisis management and stabilization.  2. Medication management: Continue current plan of care in progress..  3. Encouraged patient to attend groups and participate in group counseling sessions and activities 4. Address health issues: Vitals reviewed and stable.    Medical Decision Making Problem Points:  Established problem, stable/improving (1), Review of last therapy session (1) and Review of psycho-social stressors (1) Data Points:  Review or order clinical lab tests (1) Review or order medicine tests (1) Review and summation of old records (2) Review of medication regiment & side effects (2) Review of new  medications or change in dosage (2)  I certify that inpatient services furnished can reasonably be expected to improve the patient's condition.   Lindell Spar I FNP-BC 08/16/2013, 3:16 PM

## 2013-08-16 NOTE — Consult Note (Signed)
Case discussed, agree with plan 

## 2013-08-16 NOTE — Progress Notes (Signed)
Inpatient Diabetes Program Recommendations  AACE/ADA: New Consensus Statement on Inpatient Glycemic Control (2013)  Target Ranges:  Prepandial:   less than 140 mg/dL      Peak postprandial:   less than 180 mg/dL (1-2 hours)      Critically ill patients:  140 - 180 mg/dL   Reason for Assessment: New-onset DM  Diabetes history: None Outpatient Diabetes medications: N/A Current orders for Inpatient glycemic control: Novolog moderate tidwc, metformin 1000 mg bid   33 y.o. male who presents to the ED complaining of depression and suicidal. He states that he has an h/o depression. [facility] but states he did not take the medications that were prescribed. No preveious hx DM, although has many family members with DM. Per progress notes, pt upset regarding diagnosis of DM.  Results for Angela CoxOLBERT, Fahd J (MRN [id number]) as of 08/16/2013 11:39  Ref. Range 08/14/2013 19:20  Sodium Latest Range: 137-147 mEq/L 140  Potassium Latest Range: 3.7-5.3 mEq/L 4.5  Chloride Latest Range: 96-112 mEq/L 100  CO2 Latest Range: 19-32 mEq/L 29  Mean Plasma Glucose Latest Range: <117 mg/dL 782220 (H)  BUN Latest Range: 6-23 mg/dL 15  Creatinine Latest Range: 0.50-1.35 mg/dL 9.561.06  Calcium Latest Range: 8.4-10.5 mg/dL 9.6  GFR calc non Af Amer Latest Range: >90 mL/min >90  GFR calc Af Amer Latest Range: >90 mL/min >90  Glucose Latest Range: 70-99 mg/dL 213202 (H)  Anion gap Latest Range: 5-15  11  Results for Angela CoxOLBERT, Rual J (MRN [id number]) as of 08/16/2013 11:39  Ref. Range 08/15/2013 06:10 08/15/2013 11:50 08/15/2013 16:55 08/15/2013 20:31 08/16/2013 06:14 08/16/2013 11:52  Glucose-Capillary Latest Range: 70-99 mg/dL 629202 (H) 528179 (H) 413143 (H) 192 (H) 178 (H) 155 (H)   Results for Angela CoxOLBERT, Silvester J (MRN [id number]) as of 08/16/2013 11:39  Ref. Range 08/14/2013 19:20  Hemoglobin A1C Latest Range: <5.7 % 9.3 (H)   New diagnosis of Type 2 DM. To speak with RD regarding healthy diet  options.  Recommendations: Will order Living Well With Diabetes book from pharmacy. Agree with metformin 1000 mg bid. May need additional meds (glipizide10 mg bid) if blood sugars >200 mg/dL. Needs glucose meter prescription (meter, strips and lancets) for monitoring at home Will need PCP ([facility] to manage DM. Would benefit from OP Diabetes  Education consult for newly diagnosed DM.  Will continue to follow while inpatient. Thank you. Ailene Ardshonda Greyson Riccardi, RD, LDN, CDE Inpatient Diabetes Coordinator [phone]

## 2013-08-16 NOTE — [facility] Group Notes (Signed)
[facility]  08/16/2013 8:45 AM  Participation Quality: Alert, Appropriate and Oriented  Mood/Affect: Flat; Lethargic  Depression Rating: 4  Anxiety Rating: 6  Thoughts of Suicide: Pt reports SI  Will you contract for safety? Yes  Current AVH: Pt denies  Plan for Discharge/Comments: Pt attended discharge planning group and actively participated in group. CSW provided pt with today's workbook. Pt reports that he is unsure of discharge plan.    Transportation Means: Pt reports access to transportation  Supports: No supports mentioned at this time  Chad CordialLauren Carter, LCSWA 08/16/2013 10:12 AM

## 2013-08-16 NOTE — Progress Notes (Signed)
D: Pt mood is depressed and anxious.  Affect is flat.  Pt complains of sleeplessness.  Denies pain.    A: Patient given emotional support from RN. Patient encouraged to come to staff with concerns and/or questions. Patient's medication routine continued. Patient's orders and plan of care reviewed. Will continue to monitor patient q15 minutes for safety.  PRN trazodone administered for sleep deprivation.     R: Patient remains appropriate and cooperative.

## 2013-08-16 NOTE — [facility] Group Notes (Signed)
[facility] LCSW Group Therapy  08/16/2013 1:15pm   Type of Therapy: Group Therapy- Overcoming Obstacles  Participation Level: Active   Participation Quality:  Appropriate and Drowsy  Affect:  Flat   Cognitive: Alert and Oriented   Insight:  Lacking   Engagement in Therapy: Developing/Improving  Modes of Intervention: Clarification, Confrontation, Discussion, Education, Exploration, Limit-setting, Orientation, Problem-solving, Rapport Building, Dance movement psychotherapisteality Testing, Socialization and Support   Summary of Progress/Problems: Pt identified obstacles faced currently and processed barriers involved in overcoming these obstacles. Pt identified steps necessary for overcoming these obstacles and explored motivation (internal and external) for facing these difficulties head on. Pt further identified one area of concern in their lives and chose a goal to focus on for today. Pt participated when prompted during group discussion but supplied only vague answers.  Pt stated that his homelessness and depression are obstacles to "being a better person" and obtaining some independence.  Pt describes that he would like to change and overcome these obstacles by "making better choices" and "not letting things bother me."  Pt demonstrates limited insight AEB unclear and vague answers.  Pt appears disengaged through parts of group session.    Therapeutic Modalities:   Cognitive Behavioral Therapy Solution Focused Therapy Motivational Interviewing Relapse Prevention Therapy  Chad CordialLauren Carter, LCSWA 08/16/2013 3:36 PM

## 2013-08-17 LAB — GLUCOSE, CAPILLARY
GLUCOSE-CAPILLARY: 147 mg/dL — AB (ref 70–99)
GLUCOSE-CAPILLARY: 152 mg/dL — AB (ref 70–99)
GLUCOSE-CAPILLARY: 144 mg/dL — AB (ref 70–99)
Glucose-Capillary: 152 mg/dL — ABNORMAL HIGH (ref 70–99)
Glucose-Capillary: 183 mg/dL — ABNORMAL HIGH (ref 70–99)

## 2013-08-17 NOTE — [facility] Group Notes (Signed)
[facility] Group Notes:  orientation  Date:  08/17/2013  Time:  10:05 AM  Type of Therapy:  Nurse Education  Participation Level:  Did Not Attend  Participation Quality:  Inattentive  Affect:  Flat  Cognitive:  Lacking  Insight:  None  Engagement in Group:  None  Modes of Intervention:  Discussion  Summary of Progress/Problems:Pt did not attend  Lawrence Mccormick, Lawrence Mccormick 08/17/2013, 10:05 AM

## 2013-08-17 NOTE — Progress Notes (Signed)
Patient ID: Lawrence Mccormick, male   DOB: February 18, 1980, 33 y.o.   MRN: [id number] [facility] MD Progress Note  08/17/2013 1:01 PM Lawrence Mccormick  MRN:  [id number]  Subjective:  States he is feeling nauseous today. Denies any vomiting. Denies diarrhea. Objective: Patient reports some improvement,and he states the " noises" he was hearing have improved/resolved. He is a little less depressed. He is still concerned about housing issues, and where he will be living after discharge. He has been speaking with his aunt, with whom he had been living, but it does not appear he will be able to return to live there. He is less ruminative about the poor relationship with his mother, which had exacerbated his depression and contributed to admission. He has newly diagnosed DM, and is now on Metformin. Denies side effects. I appreciate hospitalist involvement and management. We discussed importance of following up with medication, diet recommendations, and monitoring. Tolerating Zoloft well.  Diagnosis:    Major Depressive Disorder - Severe (296.23)  Total Time spent with patient: 20 minutes    ADL's:  Intact  Sleep: Good  Appetite:  Good  Suicidal Ideation:  Denies   Homicidal Ideation:  Denies  AEB (as evidenced by):  Psychiatric Specialty Exam: Physical Exam  Review of Systems  Psychiatric/Behavioral: Positive for depression and suicidal ideas. Negative for hallucinations and substance abuse. The patient has insomnia.   All other systems reviewed and are negative.   Blood pressure 113/47, pulse 88, temperature 98.1 F (36.7 C), temperature source Oral, resp. rate 18, height 5\' 5"  (1.651 m), weight 82.101 kg (181 lb), SpO2 98.00%.Body mass index is 30.12 kg/(m^2).  General Appearance:improved grooming   Eye Contact::  Improved   Speech:  Clear and Coherent  Volume:  Normal  Mood:  Depressed  Affect:  Still constricted but improving  Thought Process:  Goal Directed and Intact  Orientation:   Full (Time, Place, and Person)  Thought Content:  No hallucinations, no delusions  Suicidal Thoughts:  No- denies any SI thoughts, and contracts for safety on the unit.   Homicidal Thoughts:  No  Memory: NA  Judgement:  Good  Insight:  Good  Psychomotor Activity:  Normal  Concentration:  Fair  Recall:  Fair  Fund of Knowledge:Fair  Language: Good  Akathisia:  No  Handed:  Right  AIMS (if indicated):     Assets:  Communication Skills Desire for Improvement Housing Social Support Vocational/Educational  Sleep:  Number of Hours: 5.75   Musculoskeletal: Strength & Muscle Tone: within normal limits Gait & Station: normal Patient leans: N/A  Current Medications: Current Facility-Administered Medications  Medication Dose Route Frequency Provider Last Rate Last Dose  . acetaminophen (TYLENOL) tablet 650 mg  650 mg Oral Q4H PRN Nanine Means, NP      . acetaminophen (TYLENOL) tablet 650 mg  650 mg Oral Q6H PRN Nanine Means, NP   650 mg at 08/15/13 1027  . alum & mag hydroxide-simeth (MAALOX/MYLANTA) 200-200-20 MG/5ML suspension 30 mL  30 mL Oral Q4H PRN Nanine Means, NP      . aspirin chewable tablet 81 mg  81 mg Oral Daily Nanine Means, NP   81 mg at 08/17/13 0806  . ibuprofen (ADVIL,MOTRIN) tablet 600 mg  600 mg Oral Q8H PRN Nanine Means, NP      . insulin aspart (novoLOG) injection 0-15 Units  0-15 Units Subcutaneous TID WC Nanine Means, NP   2 Units at 08/17/13 1203  . living well with diabetes book  MISC   Does not apply Once Jeralyn Bennett, MD      . magnesium hydroxide (MILK OF MAGNESIA) suspension 30 mL  30 mL Oral Daily PRN Nanine Means, NP      . metFORMIN (GLUCOPHAGE) tablet 1,000 mg  1,000 mg Oral BID WC Jeralyn Bennett, MD   1,000 mg at 08/17/13 0806  . sertraline (ZOLOFT) tablet 50 mg  50 mg Oral Daily Nehemiah Massed, MD   50 mg at 08/17/13 0806  . traZODone (DESYREL) tablet 100 mg  100 mg Oral QHS PRN Nanine Means, NP   100 mg at 08/16/13 2221    Lab Results:  Results  for orders placed during the hospital encounter of 08/13/13 (from the past 48 hour(s))  GLUCOSE, CAPILLARY     Status: Abnormal   Collection Time    08/15/13  4:55 PM      Result Value Ref Range   Glucose-Capillary 143 (*) 70 - 99 mg/dL   Comment 1 Documented in Chart     Comment 2 Notify RN    GLUCOSE, CAPILLARY     Status: Abnormal   Collection Time    08/15/13  8:31 PM      Result Value Ref Range   Glucose-Capillary 192 (*) 70 - 99 mg/dL   Comment 1 Documented in Chart    GLUCOSE, CAPILLARY     Status: Abnormal   Collection Time    08/16/13  6:14 AM      Result Value Ref Range   Glucose-Capillary 178 (*) 70 - 99 mg/dL   Comment 1 Documented in Chart    GLUCOSE, CAPILLARY     Status: Abnormal   Collection Time    08/16/13 11:52 AM      Result Value Ref Range   Glucose-Capillary 155 (*) 70 - 99 mg/dL   Comment 1 Notify RN    GLUCOSE, CAPILLARY     Status: Abnormal   Collection Time    08/16/13  4:47 PM      Result Value Ref Range   Glucose-Capillary 147 (*) 70 - 99 mg/dL   Comment 1 Notify RN    GLUCOSE, CAPILLARY     Status: Abnormal   Collection Time    08/16/13  9:45 PM      Result Value Ref Range   Glucose-Capillary 143 (*) 70 - 99 mg/dL   Comment 1 Notify RN    GLUCOSE, CAPILLARY     Status: Abnormal   Collection Time    08/17/13  6:18 AM      Result Value Ref Range   Glucose-Capillary 152 (*) 70 - 99 mg/dL  GLUCOSE, CAPILLARY     Status: Abnormal   Collection Time    08/17/13 11:55 AM      Result Value Ref Range   Glucose-Capillary 144 (*) 70 - 99 mg/dL   Comment 1 Notify RN      Physical Findings: AIMS: Facial and Oral Movements Muscles of Facial Expression: None, normal Lips and Perioral Area: None, normal Jaw: None, normal Tongue: None, normal,Extremity Movements Upper (arms, wrists, hands, fingers): None, normal Lower (legs, knees, ankles, toes): None, normal, Trunk Movements Neck, shoulders, hips: None, normal, Overall Severity Severity of  abnormal movements (highest score from questions above): None, normal Incapacitation due to abnormal movements: None, normal Patient's awareness of abnormal movements (rate only patient's report): No Awareness, Dental Status Current problems with teeth and/or dentures?: No Does patient usually wear dentures?: No  CIWA:  CIWA-Ar Total: 2 COWS:  COWS Total Score: 1  Assessment: Gradual improvement, but still depressed, less ruminative, no longer psychotic. Concerned about recently diagnosed DM, but understanding the importance of medication and treatment compliance to address it .  Treatment Plan Summary: Daily contact with patient to assess and evaluate symptoms and progress in treatment Medication management  1. Continue crisis management and stabilization.  2. Medication management: Continue current plan of care in progress..   Increase Zoloft to 100 mgrs a day.  Medical Decision Making Problem Points:  Established problem, stable/improving (1), Review of last therapy session (1) and Review of psycho-social stressors (1) Data Points:  Review or order clinical lab tests (1) Review of new medications or change in dosage (2)  I certify that inpatient services furnished can reasonably be expected to improve the patient's condition.   Zaylia Riolo FNP-BC 08/17/2013, 1:01 PM

## 2013-08-17 NOTE — Progress Notes (Signed)
Adult Psychoeducational Group Note  Date:  08/17/2013 Time:  11:03 PM  Group Topic/Focus:  Goals Group:   The focus of this group is to help patients establish daily goals to achieve during treatment and discuss how the patient can incorporate goal setting into their daily lives to aide in recovery.  Participation Level:  Active  Participation Quality:  Appropriate  Affect:  Appropriate  Cognitive:  Appropriate  Insight: Appropriate  Engagement in Group:  Engaged  Modes of Intervention:  Discussion  Additional Comments:  Pt stated that the feeling of being alive is what makes him feel better about the day.  Terie PurserParker, Donyale Falcon R 08/17/2013, 11:03 PM

## 2013-08-17 NOTE — Progress Notes (Signed)
Patient ID: Lawrence Mccormick, male   DOB: 07/24/1980, 33 y.o.   MRN: [id number] Pt visible in the milieu. Minimal but appropriate interaction with staff and peers.  Pt rated depression 3/10 with 10 being the worst.  Pt reported that sometimes will talk to himself but denied hallucinations.  Pt stated his stressor is his family.  Pt reported having a poor relationship with his mother.  Pt stated he was no longer able to live with her so he stayed at an aunt's house for a few days but she does not like anyone living in her home either.  Pt stated he will possibly need alternative housing when discharge and is open to a group home.  Pt stated he just wanted to get somewhere where he will be able to take care of himself.  Needs assessed.  Pt denied.  Support and encouragement provided.  Pt receptive.  Fifteen minute checks in progress for patient safety.  Pt safe on unit.

## 2013-08-17 NOTE — Progress Notes (Signed)
D: Patient denies SI/HI and A/V hallucinations; patient reports sleep is fair and reports she requested medication for sleep; reports appetite is fair; reports energy level is low ; reports ability to concentrate is good; rates depression as 4/10; rates hopelessness 4/10; rates anxiety as 4/10; patient main complaint this morning was nausea and Dr. Jama Flavorsobos was made aware  A: Monitored q 15 minutes; patient encouraged to attend groups; patient educated about medications; patient given medications per physician orders; patient encouraged to express feelings and/or concerns  R: Patient is flat, sad, and blunted; patient has been in the bed all morning; patient's interaction with staff and peers is minimal and he is very vague and forwards little information.; patient was able to set goal to talk with staff 1:1 when having feelings of SI; patient is taking medications as prescribed and tolerating medications

## 2013-08-17 NOTE — [facility] Group Notes (Signed)
[facility] LCSW Group Therapy 08/17/2013 1:15 PM  Type of Therapy: Group Therapy- Feelings about Diagnosis  Participation Level: Minimal    Participation Quality:  Inattentive  Affect:  Flat   Cognitive: Alert and Oriented   Insight:  Limited   Engagement in Therapy: Developing/Improving and Engaged   Modes of Intervention: Clarification, Confrontation, Discussion, Education, Exploration, Limit-setting, Orientation, Problem-solving, Rapport Building, Dance movement psychotherapisteality Testing, Socialization and Support  Description of Group:   This group will allow patients to explore their thoughts and feelings about diagnoses they have received. Patients will be guided to explore their level of understanding and acceptance of these diagnoses. Facilitator will encourage patients to process their thoughts and feelings about the reactions of others to their diagnosis, and will guide patients in identifying ways to discuss their diagnosis with significant others in their lives. This group will be process-oriented, with patients participating in exploration of their own experiences as well as giving and receiving support and challenge from other group members.  Summary of Progress/Problems: The topic for group therapy was feelings about diagnosis. Pt participated minimally in group discussion, but was able to state that a symptom of his depression is increased irritability.  Pt unable to describe how he copes with the stigma related to mental health or any way that his diagnoses have impacted his relationships with friends and family.  Pt seemed inattentive and lethargic for most of the group session.  Therapeutic Modalities:   Cognitive Behavioral Therapy Solution Focused Therapy Motivational Interviewing Relapse Prevention Therapy  Chad CordialLauren Carter, LCSWA 08/17/2013 5:06 PM

## 2013-08-17 NOTE — Progress Notes (Signed)
Pt resting in bed with eyes closed.  No distress observed.  Respirations even/unlabored.  Safety maintained with q15 minute checks. 

## 2013-08-17 NOTE — Progress Notes (Signed)
Recreation Therapy Notes  Animal-Assisted Activity/Therapy (AAA/T) Program Checklist/Progress Notes Patient Eligibility Criteria Checklist & Daily Group note for Rec Tx Intervention  Date: 07.21.2015 Time: 3:15pm [address]    AAA/T Program Assumption of Risk Form signed by Patient/ or Parent Legal Guardian yes  Patient is free of allergies or sever asthma yes  Patient reports no fear of animals yes  Patient reports no history of cruelty to animals yes   Patient understands his/her participation is voluntary yes  Patient washes hands before animal contact yes  Patient washes hands after animal contact yes  Behavioral Response: Engaged, Appropriate   Education: Hand Washing, Appropriate Animal Interaction   Education Outcome: Acknowledges understanding  Clinical Observations/Feedback: Patient actively engaged with therapy dog, petting him appropriately.   Marykay Lexenise L Campbell Agramonte, LRT/CTRS        Jearl KlinefelterBlanchfield, Riggins Cisek L 08/17/2013 4:43 PM

## 2013-08-17 NOTE — Tx Team (Signed)
  Date: 08/17/2013 9:26 AM  Progress in Treatment:  Attending groups: Yes  Participating in groups: Minimally  Taking medication as prescribed: Yes  Tolerating medication: Yes  Family/Significant othe contact made:  Patient understands diagnosis: Yes Discussing patient identified problems/goals with staff: Yes  Medical problems stabilized or resolved: Yes  Denies suicidal/homicidal ideation: Yes Patient has not harmed self or Others: Yes   New problem(s) identified:   Discharge Plan or Barriers: CSW to assess.  Pt reports being homeless and unsure of discharge plan.  Additional comments: PT is a 33 year old man, who states he came to the hospital at the prompting of an uncle " because I was kind of having crazy thoughts". States he has been feeling depressed and has been having thoughts of suicide, mostly passive thoughts such as " wishing I would die", " wishing not to be here". States he was also very angry with his mother, but at this time denies any homicidal ideations/violent ideations towards her. He was having some thoughts of overdosing. Patient states he has been " stressed out", and reports that he has a stormy, strained relatronship with his mother. States " when I visit her it starts good, but then she makes me feel like she hates me more than anyone".   Pt is homeless and is unsure of where he will follow-up.   Reason for Continuation of Hospitalization:  Depression Medication stabilization Suicidal ideation  Estimated length of stay: 3-5 days  For review of initial/current patient goals, please see plan of care.   Attendees:  Attendees:  Patient:    Family:    Physician: Dr. Jama Flavorsobos, MD  08/17/2013 9:26 AM  Nursing: Onnie BoerJennifer Clark, RN Case manager  08/17/2013 9:26 AM  Clinical Social Worker Lamar SprinklesLauren Carter, LCSWA, MSW 08/17/2013 9:26 AM  Other: Ruta Hindselora, P4CC 08/17/2013 9:26 AM  Other: Marylu LundJanet,  RN 08/17/2013 9:26 AM  Other: , RN Charge Nurse 08/17/2013 9:26 AM  Other: Vikki PortsValerie,  [facility] Liaison    Chad CordialLauren Carter, LCSWA 08/17/2013 10:02 AM

## 2013-08-18 DIAGNOSIS — F322 Major depressive disorder, single episode, severe without psychotic features: Secondary | ICD-10-CM

## 2013-08-18 LAB — GLUCOSE, CAPILLARY
GLUCOSE-CAPILLARY: 125 mg/dL — AB (ref 70–99)
GLUCOSE-CAPILLARY: 229 mg/dL — AB (ref 70–99)
Glucose-Capillary: 108 mg/dL — ABNORMAL HIGH (ref 70–99)
Glucose-Capillary: 88 mg/dL (ref 70–99)

## 2013-08-18 MED ORDER — SERTRALINE HCL 100 MG PO TABS
100.0000 mg | ORAL_TABLET | Freq: Every day | ORAL | Status: DC
  Administered 2013-08-19 – 2013-08-20 (×2): 100 mg via ORAL
  Filled 2013-08-18 (×2): qty 1
  Filled 2013-08-18: qty 14
  Filled 2013-08-18: qty 1
  Filled 2013-08-18: qty 14

## 2013-08-18 NOTE — Progress Notes (Signed)
D: Patient denies SI/HI and A/V hallucinations; patient reports sleep is fair; reports appetite is fair; reports energy level is normal ; reports ability to concentrate is good; rates depression as 3/10; rates hopelessness 3/10; rates anxiety as 3/10;   A: Monitored q 15 minutes; patient encouraged to attend groups; patient educated about medications; patient given medications per physician orders; patient encouraged to express feelings and/or concerns  R: Patient is cooperative and appropriate to circumstances; patient is pleasant; patient's interaction with staff and peers is appropriate; patient was able to set goal to talk with staff 1:1 when having feelings of SI; patient is taking medications as prescribed and tolerating medications; patient is attending all groups

## 2013-08-18 NOTE — Progress Notes (Signed)
D Pt. Denies SI and HI, no complaints of pain or discomfort noted.  A Writer offered support and encouragement, discussed coping skills with pt. And discharge plans.  R Pt. 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  Pt. Is close to his Uncle but is unsure if he will be able to live in the home with his family at discharge.  Pt. Rates his depression at a 3 and his anxiety a 3.

## 2013-08-18 NOTE — Progress Notes (Addendum)
Patient ID: Lawrence Mccormick, male   DOB: 02-13-1980, 33 y.o.   MRN: [id number] [facility] MD Progress Note  08/18/2013 1:38 PM Lawrence Mccormick  MRN:  [id number]  Subjective:  Today feeling better. Nausea has resolved. Objective:  He reports improvement. He states his mother sent him a message through his uncle about trying to make peace and " giving her another chance" . He states that he is now thinking of returning to mother's home, but is still focused on " getting my own place when I can". States " I want to get into section 8 housing" Denies any ongoing hallucinations. Mood is better, although still somewhat depressed and constricted.  Diabetes improving, serum glucose was 108 today. So far tolerating Metformin well. Tolerating Zoloft well.  Diagnosis:    Major Depressive Disorder - Severe (296.23)  Total Time spent with patient: 20 minutes    ADL's:  Intact  Sleep: fair   Appetite:  Good  Suicidal Ideation:  Denies   Homicidal Ideation:  Denies  AEB (as evidenced by):  Psychiatric Specialty Exam: Physical Exam  Review of Systems  Psychiatric/Behavioral: Positive for depression and suicidal ideas. Negative for hallucinations and substance abuse. The patient has insomnia.   All other systems reviewed and are negative.   Blood pressure 122/71, pulse 70, temperature 98.7 F (37.1 C), temperature source Oral, resp. rate 18, height 5\' 5"  (1.651 m), weight 82.101 kg (181 lb), SpO2 98.00%.Body mass index is 30.12 kg/(m^2).  General Appearance:improved grooming   Eye Contact::  Improved   Speech:  Clear and Coherent  Volume:  Normal  Mood:  Less depressed   Affect:  Still constricted but improving  Thought Process:  Goal Directed and Intact  Orientation:  Full (Time, Place, and Person)  Thought Content:  No hallucinations, no delusions  Suicidal Thoughts:  No- denies any SI thoughts, and contracts for safety on the unit.   Homicidal Thoughts:  No  Memory: NA  Judgement:   Good  Insight:  Good  Psychomotor Activity:  Normal  Concentration:  Fair  Recall:  Fair  Fund of Knowledge:Fair  Language: Good  Akathisia:  No  Handed:  Right  AIMS (if indicated):     Assets:  Communication Skills Desire for Improvement Housing Social Support Vocational/Educational  Sleep:  Number of Hours: 4.25   Musculoskeletal: Strength & Muscle Tone: within normal limits Gait & Station: normal Patient leans: N/A  Current Medications: Current Facility-Administered Medications  Medication Dose Route Frequency Provider Last Rate Last Dose  . acetaminophen (TYLENOL) tablet 650 mg  650 mg Oral Q4H PRN Nanine Means, NP      . acetaminophen (TYLENOL) tablet 650 mg  650 mg Oral Q6H PRN Nanine Means, NP   650 mg at 08/15/13 1027  . alum & mag hydroxide-simeth (MAALOX/MYLANTA) 200-200-20 MG/5ML suspension 30 mL  30 mL Oral Q4H PRN Nanine Means, NP      . aspirin chewable tablet 81 mg  81 mg Oral Daily Nanine Means, NP   81 mg at 08/18/13 0804  . ibuprofen (ADVIL,MOTRIN) tablet 600 mg  600 mg Oral Q8H PRN Nanine Means, NP      . insulin aspart (novoLOG) injection 0-15 Units  0-15 Units Subcutaneous TID WC Nanine Means, NP   2 Units at 08/18/13 [phone]  . living well with diabetes book MISC   Does not apply Once Jeralyn Bennett, MD      . magnesium hydroxide (MILK OF MAGNESIA) suspension 30 mL  30 mL  Oral Daily PRN Nanine MeansJamison Lord, NP      . metFORMIN (GLUCOPHAGE) tablet 1,000 mg  1,000 mg Oral BID WC Jeralyn BennettEzequiel Zamora, MD   1,000 mg at 08/18/13 0804  . sertraline (ZOLOFT) tablet 50 mg  50 mg Oral Daily Nehemiah MassedFernando Cobos, MD   50 mg at 08/18/13 0804  . traZODone (DESYREL) tablet 100 mg  100 mg Oral QHS PRN Nanine MeansJamison Lord, NP   100 mg at 08/16/13 2221    Lab Results:  Results for orders placed during the hospital encounter of 08/13/13 (from the past 48 hour(s))  GLUCOSE, CAPILLARY     Status: Abnormal   Collection Time    08/16/13  4:47 PM      Result Value Ref Range   Glucose-Capillary 147  (*) 70 - 99 mg/dL   Comment 1 Notify RN    GLUCOSE, CAPILLARY     Status: Abnormal   Collection Time    08/16/13  9:45 PM      Result Value Ref Range   Glucose-Capillary 143 (*) 70 - 99 mg/dL   Comment 1 Notify RN    GLUCOSE, CAPILLARY     Status: Abnormal   Collection Time    08/17/13  6:18 AM      Result Value Ref Range   Glucose-Capillary 152 (*) 70 - 99 mg/dL  GLUCOSE, CAPILLARY     Status: Abnormal   Collection Time    08/17/13 11:55 AM      Result Value Ref Range   Glucose-Capillary 144 (*) 70 - 99 mg/dL   Comment 1 Notify RN    GLUCOSE, CAPILLARY     Status: Abnormal   Collection Time    08/17/13  4:49 PM      Result Value Ref Range   Glucose-Capillary 152 (*) 70 - 99 mg/dL  GLUCOSE, CAPILLARY     Status: Abnormal   Collection Time    08/17/13  9:33 PM      Result Value Ref Range   Glucose-Capillary 183 (*) 70 - 99 mg/dL  GLUCOSE, CAPILLARY     Status: Abnormal   Collection Time    08/18/13  6:17 AM      Result Value Ref Range   Glucose-Capillary 125 (*) 70 - 99 mg/dL  GLUCOSE, CAPILLARY     Status: Abnormal   Collection Time    08/18/13 11:52 AM      Result Value Ref Range   Glucose-Capillary 108 (*) 70 - 99 mg/dL    Physical Findings: AIMS: Facial and Oral Movements Muscles of Facial Expression: None, normal Lips and Perioral Area: None, normal Jaw: None, normal Tongue: None, normal,Extremity Movements Upper (arms, wrists, hands, fingers): None, normal Lower (legs, knees, ankles, toes): None, normal, Trunk Movements Neck, shoulders, hips: None, normal, Overall Severity Severity of abnormal movements (highest score from questions above): None, normal Incapacitation due to abnormal movements: None, normal Patient's awareness of abnormal movements (rate only patient's report): No Awareness, Dental Status Current problems with teeth and/or dentures?: No Does patient usually wear dentures?: No  CIWA:  CIWA-Ar Total: 2 COWS:  COWS Total Score:  1  Assessment: Although remains depressed, he is gradually improving. He states he might be able to return to mother's home but is still considering of going to another housing situation and is hoping to eventually get section 8 housing. He is gradually becoming more familiarized with DM and dietary recommendations, and has tolerated Metformin well without side effects. BLood glucose today 108 ( much improved).;  Tolerating Zoloft well. Treatment Plan Summary: Daily contact with patient to assess and evaluate symptoms and progress in treatment Medication management  1. Continue crisis management and stabilization.  2. Medication management: Continue current plan of care in progress..   Increase Zoloft to 100 mgrs a day.  Medical Decision Making Problem Points:  Established problem, stable/improving (1) and Review of psycho-social stressors (1) Data Points:  Review or order clinical lab tests (1) Review of new medications or change in dosage (2)  I certify that inpatient services furnished can reasonably be expected to improve the patient's condition.   COBOS, FERNANDO FNP-BC 08/18/2013, 1:38 PM

## 2013-08-18 NOTE — Progress Notes (Signed)
Adult Psychoeducational Group Note  Date:  08/18/2013 Time:  10:00am Group Topic/Focus:  Personal Choices and Values:   The focus of this group is to help patients assess and explore the importance of values in their lives, how their values affect their decisions, how they express their values and what opposes their expression.  Participation Level:  Minimal  Participation Quality:  Appropriate and Attentive  Affect:  Appropriate  Cognitive:  Alert and Appropriate  Insight: Appropriate  Engagement in Group:  Engaged  Modes of Intervention:  Discussion and Education  Additional Comments:    Pt attended and participated in group. Discussion was on personal development and what it means to you. Pt stated personal development means being a better person.  Shelly BombardGarner, Jamonta Goerner D 08/18/2013, 4:16 PM

## 2013-08-18 NOTE — [facility] Group Notes (Signed)
Adult Psychoeducational Group Note  Date:  08/18/2013 Time:  9:19 PM  Group Topic/Focus:  Wrap-Up Group:   The focus of this group is to help patients review their daily goal of treatment and discuss progress on daily workbooks.  Participation Level:  Active  Participation Quality:  Appropriate  Affect:  Appropriate  Cognitive:  Alert  Insight: Appropriate  Engagement in Group:  Engaged  Modes of Intervention:  Discussion  Additional Comments:  Pt stated that today was a pretty good day but nothing great. Pt is excited to be d/c soon.   Ledora BottcherHolcomb, Rosalynd Mcwright G 08/18/2013, 9:19 PM

## 2013-08-18 NOTE — [facility] Group Notes (Signed)
[facility] Dis[facility] Note   08/18/2013 8:45 AM  Participation Quality:  Alert, Appropriate and Oriented  Mood/Affect:  Flat and Depressed  Depression Rating:  4  Anxiety Rating:  3  Thoughts of Suicide:  Pt denies SI/HI  Will you contract for safety?   Yes  Current AVH:  Pt denies  Plan for Discharge/Comments:  Pt attended discharge planning group and actively participated in group.  CSW provided pt with today's workbook.  [place] and needing assistance with info on shelter/housing.  CSW will provide pt with this today.  CSW will assess for appropriate referrals.  No further needs voiced by pt at this time.    Transportation Means: Pt reports access to transportation  Supports: No supports mentioned at this time  Reyes IvanChelsea Horton, LCSW 08/18/2013 9:49 AM

## 2013-08-18 NOTE — Progress Notes (Signed)
Adult Psychoeducational Group Note  Date:  08/18/2013 Time:  1:15pm Group Topic/Focus:  thearapetic ball  Participation Level:  Active  Participation Quality:  Appropriate and Attentive  Affect:  Appropriate  Cognitive:  Alert and Appropriate  Insight: Appropriate  Engagement in Group:  Engaged  Modes of Intervention:  Discussion and Education  Additional Comments:  Pt attended and participated in group. Therapeutic ball was played in group. Pt was asked to answer the question in which the left hand landed on. The question was What do you want to accomplish when you leave behavioral health? Pt stated to be a better person.  Shelly BombardGarner, Jerriann Schrom D 08/18/2013, 4:51 PM

## 2013-08-19 LAB — GLUCOSE, CAPILLARY
GLUCOSE-CAPILLARY: 130 mg/dL — AB (ref 70–99)
Glucose-Capillary: 101 mg/dL — ABNORMAL HIGH (ref 70–99)
Glucose-Capillary: 153 mg/dL — ABNORMAL HIGH (ref 70–99)

## 2013-08-19 NOTE — Progress Notes (Signed)
Patient ID: Lawrence Mccormick, male   DOB: 1980-09-03, 33 y.o.   MRN: [id number] [facility] MD Progress Note  08/19/2013 3:33 PM Lawrence Mccormick  MRN:  [id number]  Subjective: Patient states he is " feeling better" Objective:  He is less depressed, and affect less constricted. At this time denies hallucinations and does not appear internally preoccupied. He has been going to most groups, and is participative. Notes indicate he still presents with a constricted affect at times. He is working on adapting to dietary and medication changes associated with newly diagnosed diabetes mellitus. We spoke about the importance of ongoing outpatient medical management and monitoring via PCP.  He has been working with SW regarding disposition plans, housing issues, as he states he cannot return to aunt's home at this time. Tolerating Zoloft well. No side effects reported.   Diagnosis:    Major Depressive Disorder - Severe (296.23)  Total Time spent with patient: 20 minutes    ADL's:  Intact  Sleep: Good   Appetite:  Good  Suicidal Ideation:  Denies   Homicidal Ideation:  Denies  AEB (as evidenced by):  Psychiatric Specialty Exam: Physical Exam  Review of Systems  Psychiatric/Behavioral: Positive for depression and suicidal ideas. Negative for hallucinations and substance abuse. The patient has insomnia.   All other systems reviewed and are negative.   Blood pressure 116/69, pulse 63, temperature 97.4 F (36.3 C), temperature source Oral, resp. rate 18, height 5\' 5"  (1.651 m), weight 82.101 kg (181 lb), SpO2 98.00%.Body mass index is 30.12 kg/(m^2).  General Appearance:improved grooming   Eye Contact::  Improved   Speech:  Clear and Coherent  Volume:  Normal  Mood:  Less depressed   Affect:  Remains constricted but has been improving compared to admission  Thought Process:  Goal Directed and Intact  Orientation:  Full (Time, Place, and Person)  Thought Content:  No hallucinations, no  delusions  Suicidal Thoughts:  No- denies any SI thoughts, and contracts for safety on the unit.   Homicidal Thoughts:  No  Memory: NA  Judgement:  Fair  Insight:  Fair  Psychomotor Activity:  Normal- no psychomotor retardation noted.   Concentration:  Fair  Recall:  [employer] of Knowledge:Fair  Language: Good  Akathisia:  No  Handed:  Right  AIMS (if indicated):     Assets:  Communication Skills Desire for Improvement Housing Social Support Vocational/Educational  Sleep:  Number of Hours: 6.25   Musculoskeletal: Strength & Muscle Tone: within normal limits Gait & Station: normal Patient leans: N/A  Current Medications: Current Facility-Administered Medications  Medication Dose Route Frequency Provider Last Rate Last Dose  . acetaminophen (TYLENOL) tablet 650 mg  650 mg Oral Q4H PRN Nanine Means, NP      . acetaminophen (TYLENOL) tablet 650 mg  650 mg Oral Q6H PRN Nanine Means, NP   650 mg at 08/15/13 1027  . alum & mag hydroxide-simeth (MAALOX/MYLANTA) 200-200-20 MG/5ML suspension 30 mL  30 mL Oral Q4H PRN Nanine Means, NP      . aspirin chewable tablet 81 mg  81 mg Oral Daily Nanine Means, NP   81 mg at 08/19/13 0754  . ibuprofen (ADVIL,MOTRIN) tablet 600 mg  600 mg Oral Q8H PRN Nanine Means, NP      . insulin aspart (novoLOG) injection 0-15 Units  0-15 Units Subcutaneous TID WC Nanine Means, NP   2 Units at 08/19/13 [phone]  . living well with diabetes book MISC   Does not  apply Once Jeralyn BennettEzequiel Zamora, MD      . magnesium hydroxide (MILK OF MAGNESIA) suspension 30 mL  30 mL Oral Daily PRN Nanine MeansJamison Lord, NP      . metFORMIN (GLUCOPHAGE) tablet 1,000 mg  1,000 mg Oral BID WC Jeralyn BennettEzequiel Zamora, MD   1,000 mg at 08/19/13 0754  . sertraline (ZOLOFT) tablet 100 mg  100 mg Oral Daily Nehemiah MassedFernando Lyriq Jarchow, MD   100 mg at 08/19/13 0754  . traZODone (DESYREL) tablet 100 mg  100 mg Oral QHS PRN Nanine MeansJamison Lord, NP   100 mg at 08/18/13 2232    Lab Results:  Results for orders placed during the  hospital encounter of 08/13/13 (from the past 48 hour(s))  GLUCOSE, CAPILLARY     Status: Abnormal   Collection Time    08/17/13  4:49 PM      Result Value Ref Range   Glucose-Capillary 152 (*) 70 - 99 mg/dL  GLUCOSE, CAPILLARY     Status: Abnormal   Collection Time    08/17/13  9:33 PM      Result Value Ref Range   Glucose-Capillary 183 (*) 70 - 99 mg/dL  GLUCOSE, CAPILLARY     Status: Abnormal   Collection Time    08/18/13  6:17 AM      Result Value Ref Range   Glucose-Capillary 125 (*) 70 - 99 mg/dL  GLUCOSE, CAPILLARY     Status: Abnormal   Collection Time    08/18/13 11:52 AM      Result Value Ref Range   Glucose-Capillary 108 (*) 70 - 99 mg/dL  GLUCOSE, CAPILLARY     Status: Abnormal   Collection Time    08/18/13  4:58 PM      Result Value Ref Range   Glucose-Capillary 229 (*) 70 - 99 mg/dL   Comment 1 Notify RN    GLUCOSE, CAPILLARY     Status: None   Collection Time    08/18/13  8:41 PM      Result Value Ref Range   Glucose-Capillary 88  70 - 99 mg/dL  GLUCOSE, CAPILLARY     Status: Abnormal   Collection Time    08/19/13  6:02 AM      Result Value Ref Range   Glucose-Capillary 130 (*) 70 - 99 mg/dL  GLUCOSE, CAPILLARY     Status: Abnormal   Collection Time    08/19/13 11:49 AM      Result Value Ref Range   Glucose-Capillary 101 (*) 70 - 99 mg/dL   Comment 1 Notify RN      Physical Findings: AIMS: Facial and Oral Movements Muscles of Facial Expression: None, normal Lips and Perioral Area: None, normal Jaw: None, normal Tongue: None, normal,Extremity Movements Upper (arms, wrists, hands, fingers): None, normal Lower (legs, knees, ankles, toes): None, normal, Trunk Movements Neck, shoulders, hips: None, normal, Overall Severity Severity of abnormal movements (highest score from questions above): None, normal Incapacitation due to abnormal movements: None, normal Patient's awareness of abnormal movements (rate only patient's report): No Awareness, Dental  Status Current problems with teeth and/or dentures?: No Does patient usually wear dentures?: No  CIWA:  CIWA-Ar Total: 2 COWS:  COWS Total Score: 1  Assessment:  Patient is feeling better and is less depressed and presenting with an improved range of affect. He is tolerating Zoloft well, which was recently titrated. He has been adjusting to dietary and medication changes based on his recently diagnosed diabetes mellitus.  Treatment Plan Summary: Daily contact with  patient to assess and evaluate symptoms and progress in treatment Medication management  1. Continue crisis management and stabilization.  2. Medication management: Continue current plan of care in progress..   Increase Zoloft to 100 mgrs a day.  Medical Decision Making Problem Points:  Established problem, stable/improving (1) and Review of psycho-social stressors (1) Data Points:  Review of new medications or change in dosage (2)  I certify that inpatient services furnished can reasonably be expected to improve the patient's condition.   Selinda Korzeniewski FNP-BC 08/19/2013, 3:33 PM

## 2013-08-19 NOTE — Progress Notes (Signed)
Adult Psychoeducational Group Note  Date:  08/19/2013 Time:  10:00am Group Topic/Focus:  Making Healthy Choices:   The focus of this group is to help patients identify negative/unhealthy choices they were using prior to admission and identify positive/healthier coping strategies to replace them upon discharge.  Participation Level:  Active  Participation Quality:  Appropriate and Attentive  Affect:  Appropriate  Cognitive:  Alert and Appropriate  Insight: Appropriate  Engagement in Group:  Engaged  Modes of Intervention:  Discussion and Education  Additional Comments:  Pt attended and participated in group. Group discussion was on making lifestyle changes. The question was asked what change can you make to improve your life? Pt stated to do things differently and be a better person and gain self confidence.  Shelly BombardGarner, Enjoli Tidd D 08/19/2013, 11:04 AM

## 2013-08-19 NOTE — Progress Notes (Signed)
D: Patient presents with flat, depressed affect and mood. He reported on the self inventory sheet that his sleep and appetite are both fair, energy level is normal and ability to concentrate is good. Patient rates depression/anxiety "3" and feelings of hopelessness "2". He's attending groups and visible in the milieu. Adheres to medication regimen.  A: Support and encouragement provided to patient. Administered scheduled medications per ordering MD. Monitor Q15 minute checks for safety.  R: Patient receptive. Denies SI/HI and auditory/visual hallucinations. Patient remains safe on the unit.

## 2013-08-19 NOTE — [facility] Suicide Risk Assessment (Signed)
[facility] INPATIENT:  Family/Significant Other Suicide Prevention Education  Suicide Prevention Education:  Education Completed; Sonny DandyBonita Gilchorist - aunt [phone]),  (name of family member/significant other) has been identified by the patient as the family member/significant other with whom the patient will be residing, and identified as the person(s) who will aid the patient in the event of a mental health crisis (suicidal ideations/suicide attempt).  With written consent from the patient, the family member/significant other has been provided the following suicide prevention education, prior to the and/or following the discharge of the patient.  The suicide prevention education provided includes the following:  Suicide risk factors  Suicide prevention and interventions  National Suicide Hotline telephone number  [facility] assessment telephone number  [facility] Emergency Assistance 911  [facility] and/or Residential Mobile Crisis Unit telephone number  Request made of family/significant other to:  Remove weapons (e.g., guns, rifles, knives), all items previously/currently identified as safety concern.    Remove drugs/medications (over-the-counter, prescriptions, illicit drugs), all items previously/currently identified as a safety concern.  The family member/significant other verbalizes understanding of the suicide prevention education information provided.  The family member/significant other agrees to remove the items of safety concern listed above.  Carmina MillerHorton, Ola Raap Nicole 08/19/2013, 11:17 AM

## 2013-08-19 NOTE — Progress Notes (Signed)
D: Pt denies SI/HI/AVH. Pt is pleasant and cooperative. Pt plans to stay with mom for a little while when he leaves [facility]. PT said he appreciates all the help he has been getting while he has been here.   A: Pt was offered support and encouragement. Pt was given scheduled medications. Pt was encourage to attend groups. Q 15 minute checks were done for safety.   R:Pt attends groups and interacts well with peers and staff. Pt is taking medication. Pt has no complaints at this time .Pt receptive to treatment and safety maintained on unit.

## 2013-08-19 NOTE — [facility] Group Notes (Signed)
[facility] LCSW Group Therapy 08/19/2013  2:19 PM   Type of Therapy: Group Therapy Participation Level: Active  Participation Quality: Attentive, Sharing and Supportive  Affect: Depressed and Flat  Cognitive: Alert and Oriented  Insight: Developing/Improving and Engaged  Engagement in Therapy: Developing/Improving and Engaged  Modes of Intervention: Clarification, Confrontation, Discussion, Education, Exploration, Limit-setting, Orientation, Problem-solving, Rapport Building, Dance movement psychotherapisteality Testing, Socialization and Support  Summary of Progress/Problems: The topic for group was balance in life. Today's group focused on defining balance in one's own words, identifying things that can knock one off balance, and exploring healthy ways to maintain balance in life. Group members were asked to provide an example of a time when they felt off balance, describe how they handled that situation,and process healthier ways to regain balance in the future. Group members were asked to share the most important tool for maintaining balance that they [facility] and how they plan to apply this method after discharge. Patient discussed that he has struggled with maintaining motivation and interest in past jobs. He reported that he wants to find employment in a field that interests him.   Samuella BruinKristin Jeiden Daughtridge, MSW, [employer]CSWA Clinical Social Worker [facility] [phone]

## 2013-08-19 NOTE — Progress Notes (Signed)
Adult Psychoeducational Group Note  Date:  08/19/2013 Time:  9:00 AM  Group Topic/Focus:  Morning Wellness Group   Participation Level:  Minimal  Participation Quality:  Appropriate  Affect:  Appropriate  Cognitive:  Oriented  Insight: Appropriate  Engagement in Group:  Improving  Modes of Intervention:  Discussion  Additional Comments: Patient did not participate in the warm up exercises, but was present for group.  Harold BarbanByrd, Ronecia E 08/19/2013, 4:13 PM

## 2013-08-19 NOTE — Clinical Social Work Note (Signed)
CSW provided patient with housing information including shelter list and encouraged patient to call shelters/housing programs during hospital stay. Patient agreeable and has no further questions at this time.   Samuella BruinKristin Maan Zarcone, MSW, [employer]CSWA Clinical Social Worker [facility] [phone]

## 2013-08-19 NOTE — Progress Notes (Signed)
Adult Psychoeducational Group Note  Date:  08/19/2013 Time:  10:52 PM  Group Topic/Focus:  Wrap-Up Group:   The focus of this group is to help patients review their daily goal of treatment and discuss progress on daily workbooks.  Participation Level:  Active  Participation Quality:  Appropriate  Affect:  Appropriate  Cognitive:  Appropriate  Insight: Appropriate  Engagement in Group:  Engaged  Modes of Intervention:  Activity  Additional Comments:  Pt attended karaoke group.  Wynema BirchCagle, Auguste Tebbetts D 08/19/2013, 10:52 PM

## 2013-08-20 LAB — GLUCOSE, CAPILLARY
GLUCOSE-CAPILLARY: 131 mg/dL — AB (ref 70–99)
Glucose-Capillary: 117 mg/dL — ABNORMAL HIGH (ref 70–99)
Glucose-Capillary: 137 mg/dL — ABNORMAL HIGH (ref 70–99)

## 2013-08-20 MED ORDER — SERTRALINE HCL 100 MG PO TABS: 100.0000 mg | ORAL_TABLET | Freq: Every day | ORAL | Status: DC

## 2013-08-20 MED ORDER — METFORMIN HCL 1000 MG PO TABS: 1000.0000 mg | ORAL_TABLET | Freq: Two times a day (BID) | ORAL | Status: DC

## 2013-08-20 MED ORDER — TRAZODONE HCL 100 MG PO TABS: 100.0000 mg | ORAL_TABLET | Freq: Every evening | ORAL | Status: DC | PRN

## 2013-08-20 NOTE — [facility] Group Notes (Signed)
[facility] Note  08/20/2013 8:45 AM  Participation Quality: Alert, Appropriate and Oriented  Mood/Affect: "great" mood; appropriate affect  Depression Rating: 2  Anxiety Rating: 2  Thoughts of Suicide: Pt denies SI/HI  Will you contract for safety? Yes  Current AVH: Pt denies  Plan for Discharge/Comments: Pt attended discharge planning group and actively participated in group. CSW provided pt with today's workbook. Pt will return to his mother's home while he looks for his own place.  Pt will follow-up with [facility].    Transportation Means: Pt reports access to transportation  Supports: No supports mentioned at this time  Chad CordialLauren Carter, LCSWA 08/20/2013 1:02 PM

## 2013-08-20 NOTE — Progress Notes (Signed)
D) Pt being discharged to home accompanied by his Uncle. Affect and mood are appropriate. Pt denies SI and HI, Delusions and hallucinations. Pt rates his depression, hopelessness, and anxiety all at a 2 out of possible 10. A) All meidcations explained to Pt. Along with all follow up plans for discharge. Provided with a 1:1. Given support,      resassurance and praise. All belongings returned to Pt.  R) Pt denies SI and HI.

## 2013-08-20 NOTE — Tx Team (Signed)
  Date: 08/20/2013 11:55 AM  Progress in Treatment:  Attending groups: Yes  Participating in groups: Minimally  Taking medication as prescribed: Yes  Tolerating medication: Yes  Family/Significant othe contact made:  Patient understands diagnosis: Yes Discussing patient identified problems/goals with staff: Yes  Medical problems stabilized or resolved: Yes  Denies suicidal/homicidal ideation: Yes Patient has not harmed self or Others: Yes   New problem(s) identified:   Discharge Plan or Barriers: Pt will be discharged home to mother's house and [facility]  Additional comments: PT is a 33 year old man, who states he came to the hospital at the prompting of an uncle " because I was kind of having crazy thoughts". States he has been feeling depressed and has been having thoughts of suicide, mostly passive thoughts such as " wishing I would die", " wishing not to be here". States he was also very angry with his mother, but at this time denies any homicidal ideations/violent ideations towards her. He was having some thoughts of overdosing. Patient states he has been " stressed out", and reports that he has a stormy, strained relatronship with his mother. States " when I visit her it starts good, but then she makes me feel like she hates me more than anyone".   Pt is homeless and is unsure of where he will follow-up.   Reason for Continuation of Hospitalization:  Depression Medication stabilization Suicidal ideation  Estimated length of stay: Pt is stable to discharge today, 08/20/13, and can continue receiving treatment in the community.  For review of initial/current patient goals, please see plan of care.   Attendees:  Attendees:  Patient:    Family:    Physician: Dr. Jama Flavorsobos, MD  08/20/2013 11:55 AM  Nursing: Onnie BoerJennifer Clark, RN Case manager  08/20/2013 11:55 AM  Clinical Social Worker Lamar SprinklesLauren Carter, LCSWA, MSW 08/20/2013 11:55 AM  Other: Ruta Hindselora, P4CC 08/20/2013 11:55 AM  Other:  Marylu LundJanet,  RN 08/20/2013 11:55 AM  Other: , RN Charge Nurse 08/20/2013 11:55 AM  Other: Vikki PortsValerie, [facility] Liaison    Chad CordialLauren Carter, LCSWA 08/20/2013 11:55 AM

## 2013-08-20 NOTE — Progress Notes (Signed)
[facility] :  Will you be returning to the same living situation after discharge: Yes,  to return to mother's house At discharge, do you have transportation home?:Yes,  Pt's aunt to pick up Do you have the ability to pay for your medications:Yes,  Pt provided with a 14-day supply of medications and a 30-day prescription  Release of information consent forms completed and in the chart;  Patient's signature needed at discharge.  Patient to Follow up at: Follow-up Information   Follow up with [facility] On 08/23/2013. (Walk in on this date for hospital discharge appointment. Walk in clinic is Monday - Friday 8 am - 3 pm. They will than schedule you for medication management and therapy. )    Contact information:   [address] , [place] [address] Phone: [phone] Fax: [phone]      Patient denies SI/HI:   Yes,  Pt denies    Safety Planning and Suicide Prevention discussed:  Yes,  discussed with collateral contact.  See SPE note for further detail.  Elaina Hoopsarter, Garland Hincapie M 08/20/2013, 3:05 PM

## 2013-08-20 NOTE — [facility] Group Notes (Signed)
[facility] Group Notes:  (Nursing/MHT/Case Management/Adjunct)  Date:  08/20/2013  Time:  11:39 AM  Type of Therapy:    Participation Level:  Active  Participation Quality:  Appropriate  Affect:  Appropriate  Cognitive:  Appropriate  Insight:  Appropriate  Engagement in Group:  Engaged and Supportive  Modes of Intervention:  Discussion, Education, Exploration, Problem-solving and Support  Summary of Progress/Problems: Pt participated during the group discussion and activity.  Tania Adedams, Odelle Kosier C 08/20/2013, 11:39 AM

## 2013-08-20 NOTE — [facility] Suicide Risk Assessment (Signed)
   Demographic Factors:  33 year old man,  Single, no children, living with mother,   Total Time spent with patient: 30 minutes  Psychiatric Specialty Exam: Physical Exam  ROS  Blood pressure 137/83, pulse 63, temperature 97.8 F (36.6 C), temperature source Oral, resp. rate 20, height 5\' 5"  (1.651 m), weight 82.101 kg (181 lb), SpO2 98.00%.Body mass index is 30.12 kg/(m^2).  General Appearance: Fairly Groomed  Patent attorneyye Contact::  Good  Speech:  Normal Rate  Volume:  Normal  Mood:  at this time denies any depression, states he feels better  Affect:  Appropriate  Thought Process:  Goal Directed and Linear  Orientation:  NA- fully alert and attentive  Thought Content:  no hallucinations, no delusions  Suicidal Thoughts:  No- at this time denies any suicidal or homicidal ideations  Homicidal Thoughts:  No  Memory:  NA  Judgement:  Fair  Insight:  Fair  Psychomotor Activity:  Normal  Concentration:  Negative  Recall:  Good  Fund of Knowledge:Good  Language: Good  Akathisia:  Negative  Handed:  Right  AIMS (if indicated):     Assets:  Communication Skills Desire for Improvement Resilience  Sleep:  Number of Hours: 6.25    Musculoskeletal: Strength & Muscle Tone: within normal limits Gait & Station: normal Patient leans: N/A   Mental Status Per Nursing Assessment::   On Admission:  Suicidal ideation indicated by patient  Current Mental Status by Physician: At this time states he is feeling better, less depressed, affect is appropriate, future oriented, wanting to eventually get Section 8 Housing. Denies hallucinations. Denies SI or HI.   Loss Factors: At this time facing some legal issues ( on probation), unemployment, family issues ( although states relationship with mother now improved, and recently diagnosed DM.  Historical Factors: history of depression  Risk Reduction Factors:   Living with another person, especially a relative and Positive coping skills or problem  solving skills  Continued Clinical Symptoms:  Less depressed, mood improved.   Cognitive Features That Contribute To Risk:  No gross cognitive deficits at this time   Suicide Risk:  Mild:  Suicidal ideation of limited frequency, intensity, duration, and specificity.  There are no identifiable plans, no associated intent, mild dysphoria and related symptoms, good self-control (both objective and subjective assessment), few other risk factors, and identifiable protective factors, including available and accessible social support.  Discharge Diagnoses:   AXIS I:  Major Depression, Recurrent severe AXIS II:  Deferred AXIS III:  Recently diagnosed DM AXIS IV:  economic problems, occupational problems and limited support network. AXIS V:  51-60 moderate symptoms ( 60 upon discharge)   Plan Of Care/Follow-up recommendations:  Activity:  As tolerated Diet:  Diabetic Diet Tests:  NA Other:  As below  Is patient on multiple antipsychotic therapies at discharge:  No   Has Patient had three or more failed trials of antipsychotic monotherapy by history:  No  Recommended Plan for Multiple Antipsychotic Therapies: NA  Patient is going to be living with his mother, but states his hope is that he will be able to move out and live independently over the next few weeks. [facility] and will be [facility] for ongoing medical management of recently diagnosed DM.    COBOS, FERNANDO 08/20/2013, 10:51 AM

## 2013-08-25 NOTE — Progress Notes (Signed)
Patient Discharge Instructions:  After Visit Summary (AVS):   Faxed to:  08/25/13 Psychiatric Admission Assessment Note:   Faxed to:  08/25/13 Suicide Risk Assessment - Discharge Assessment:   Faxed to:  08/25/13 Faxed/Sent to the Next Level Care provider:  08/25/13 [facility] @ [phone]  Lawrence ReddenSheena E [place], 08/25/2013, 3:44 PM

## 2013-09-05 ENCOUNTER — Emergency Department (HOSPITAL_COMMUNITY)
Admission: EM | Admit: 2013-09-05 | Discharge: 2013-09-05 | Disposition: A | Discharge status: Home / Self Care | Payer: Self-pay | Attending: Emergency Medicine | Admitting: Emergency Medicine

## 2013-09-05 ENCOUNTER — Encounter (HOSPITAL_COMMUNITY): Payer: Self-pay | Admitting: Emergency Medicine

## 2013-09-05 DIAGNOSIS — Z202 Contact with and (suspected) exposure to infections with a predominantly sexual mode of transmission: Secondary | ICD-10-CM | POA: Insufficient documentation

## 2013-09-05 DIAGNOSIS — Z113 Encounter for screening for infections with a predominantly sexual mode of transmission: Secondary | ICD-10-CM | POA: Insufficient documentation

## 2013-09-05 DIAGNOSIS — F17200 Nicotine dependence, unspecified, uncomplicated: Secondary | ICD-10-CM | POA: Insufficient documentation

## 2013-09-05 MED ORDER — AZITHROMYCIN 250 MG PO TABS
1000.0000 mg | ORAL_TABLET | Freq: Every day | ORAL | Status: DC
  Administered 2013-09-05: 1000 mg via ORAL
  Filled 2013-09-05: qty 4

## 2013-09-05 MED ORDER — CEFTRIAXONE SODIUM 250 MG IJ SOLR
250.0000 mg | Freq: Once | INTRAMUSCULAR | Status: AC
  Administered 2013-09-05: 250 mg via INTRAMUSCULAR
  Filled 2013-09-05: qty 250

## 2013-09-05 NOTE — Discharge Instructions (Signed)
You have been treated for gonorrhea and chlamydia.  Will be notified in the next 24-48 hours if culture results are positive. Follow-up with the [facility] for further STD screening/treatment. Return to the ED for new or worsening symptoms.

## 2013-09-05 NOTE — ED Provider Notes (Signed)
CSN: [id number]     Arrival date & time 09/05/13  1513 History  This chart was scribed for non-physician practitioner, Sharilyn SitesLisa Kali Deadwyler, PA-C working with Flint MelterElliott L Wentz, MD by Greggory StallionKayla Andersen, ED scribe. This patient was seen in room WTR8/WTR8 and the patient's care was started at 3:53 PM.    Chief Complaint  Patient presents with  . Exposure to STD    1 week post exposure   The history is provided by the patient. No language interpreter was used.   HPI Comments: Lawrence Mccormick is a 33 y.o. male who presents to the Emergency Department complaining of STD exposure. States he had unprotected sex one week ago and the girl called him recently telling him that she had gonorrhea. Denies past history of STDs. Denies any physical symptoms currently.   History reviewed. No pertinent past medical history. History reviewed. No pertinent past surgical history. History reviewed. No pertinent family history. History  Substance Use Topics  . Smoking status: Current Every Day Smoker -- 0.25 packs/day for 15 years    Types: Cigarettes  . Smokeless tobacco: Not on file  . Alcohol Use: Yes     Comment: occ    Review of Systems  Genitourinary:       STD exposure  All other systems reviewed and are negative.  Allergies  Review of patient's allergies indicates no known allergies.  Home Medications   Prior to Admission medications   Not on File   BP 141/92  Pulse 69  Temp(Src) 98.3 F (36.8 C) (Oral)  Resp 18  SpO2 98%  Physical Exam  Nursing note and vitals reviewed. Constitutional: He is oriented to person, place, and time. He appears well-developed and well-nourished. No distress.  HENT:  Head: Normocephalic and atraumatic.  Mouth/Throat: Oropharynx is clear and moist.  Eyes: Conjunctivae and EOM are normal. Pupils are equal, round, and reactive to light.  Neck: Normal range of motion. Neck supple.  Cardiovascular: Normal rate, regular rhythm and normal heart sounds.   Pulmonary/Chest:  Effort normal and breath sounds normal. No respiratory distress. He has no wheezes.  Genitourinary: Testes normal and penis normal. Circumcised. No penile erythema. No discharge found.  Examination chaperoned by Greggory StallionKayla Andersen.  Musculoskeletal: Normal range of motion.  Neurological: He is alert and oriented to person, place, and time.  Skin: Skin is warm and dry. He is not diaphoretic.  Psychiatric: He has a normal mood and affect.    ED Course  Procedures (including critical care time)  DIAGNOSTIC STUDIES: Oxygen Saturation is 98% on RA, normal by my interpretation.    COORDINATION OF CARE: 3:55 PM-Discussed treatment plan which includes STD testing and treatment with pt at bedside and pt agreed to plan.   Labs Review Labs Reviewed - No data to display  Imaging Review No results found.   EKG Interpretation None      MDM   Final diagnoses:  Screening for STD (sexually transmitted disease)   Known STD exposure 1 week ago, the patient is currently asymptomatic. Gc/Chl swab sent.  Pt tx ppx with rocephin and azithromycin.  Will FU with health dept for further STD needs.  Discussed plan with patient, he/she acknowledged understanding and agreed with plan of care.  Return precautions given for new or worsening symptoms.  I personally performed the services described in this documentation, which was scribed in my presence. The recorded information has been reviewed and is accurate.  Garlon HatchetLisa M Deandria Klute, PA-C 09/05/13 1806

## 2013-09-05 NOTE — ED Notes (Signed)
Pt reports unprotected sex one week ago. Denies any symptoms of STD. Partner reported positive symptoms

## 2013-09-06 NOTE — ED Provider Notes (Signed)
Medical screening examination/treatment/procedure(s) were performed by non-physician practitioner and as supervising physician I was immediately available for consultation/collaboration.   EKG Interpretation None       Eulamae Greenstein L Alisabeth Selkirk, MD 09/06/13 1652 

## 2013-09-07 LAB — GC/CHLAMYDIA PROBE AMP
CT PROBE, AMP APTIMA: POSITIVE — AB
GC Probe RNA: NEGATIVE

## 2013-09-08 NOTE — Discharge Summary (Signed)
Physician Discharge Summary Note  Patient:  Lawrence Mccormick is an 33 y.o., male MRN:  [id number] DOB:  12/06/1981 Patient phone:  [phone] (home)  Patient address:   [address] [place] [address],  Total Time spent with patient: 30 minutes  Date of Admission:  08/13/2013 Date of Discharge: 08/20/2013  Reason for Admission:  MDD with SI  Discharge Diagnoses: Principal Problem:   Severe recurrent major depression without psychotic features Active Problems:   Hyperglycemia   Psychiatric Specialty Exam: Physical Exam  Review of Systems  Constitutional: Negative.   HENT: Negative.   Eyes: Negative.   Respiratory: Negative.   Cardiovascular: Negative.   Gastrointestinal: Negative.   Genitourinary: Negative.   Musculoskeletal: Negative.   Skin: Negative.   Neurological: Negative.   Endo/Heme/Allergies: Negative.   Psychiatric/Behavioral: Positive for depression. The patient is nervous/anxious.     Blood pressure 137/83, pulse 63, temperature 97.8 F (36.6 C), temperature source Oral, resp. rate 20, height 5\' 5"  (1.651 m), weight 82.101 kg (181 lb), SpO2 98.00%.Body mass index is 30.12 kg/(m^2).   General Appearance: Fairly Groomed   Patent attorney:: Good   Speech: Normal Rate   Volume: Normal   Mood: at this time denies any depression, states he feels better   Affect: Appropriate   Thought Process: Goal Directed and Linear   Orientation: NA- fully alert and attentive   Thought Content: no hallucinations, no delusions   Suicidal Thoughts: No- at this time denies any suicidal or homicidal ideations   Homicidal Thoughts: No   Memory: NA   Judgement: Fair   Insight: Fair   Psychomotor Activity: Normal   Concentration: Negative   Recall: Good   Fund of Knowledge:Good   Language: Good   Akathisia: Negative   Handed: Right   AIMS (if indicated):   Assets: Communication Skills  Desire for Improvement  Resilience   Sleep: Number of Hours: 6.25     Musculoskeletal:  Strength & Muscle Tone: within normal limits  Gait & Station: normal  Patient leans: N/A   DSM5:  AXIS I: Major Depression, Recurrent severe  AXIS II: Deferred  AXIS III: Recently diagnosed DM  AXIS IV: economic problems, occupational problems and limited support network.  AXIS V: 51-60 moderate symptoms ( 60 upon discharge)    Level of Care:  OP  Hospital Course:  33 year old man, who states he came to the hospital at the prompting of an uncle " because I was kind of having crazy thoughts". States he has been feeling depressed and has been having thoughts of suicide, mostly passive thoughts such as " wishing I would die", " wishing not to be here". States he was also very angry with his mother, but at this time denies any homicidal ideations/violent ideations towards her. He was having some thoughts of overdosing. Patient states he has been " stressed out", and reports that he has a stormy, strained relatronship with his mother. States " when I visit her it starts good, but then she makes me feel like she hates me more than anyone".  He states he has been at times hearing " noises", but no voices. He does not appear internally preoccupied. He had been staying with mother for about the last two months, and states his mother " put me out" Several days ago, after which he has been staying with an aunt. He states his aunt may not take him back, so that he is now facing homelessness  During Hospitalization: Medications managed,  psychoeducation, group and individual therapy. Pt currently denies SI, HI, and Psychosis. At discharge, pt rates anxiety and depression as minimal. Pt states that he does have a good supportive home environment and will followup with outpatient treatment. Affirms agreement with medication regimen and discharge plan. Denies other physical and psychological concerns at time of discharge.   Consults:  None  Significant Diagnostic Studies:   None  Discharge Vitals:   Blood pressure 137/83, pulse 63, temperature 97.8 F (36.6 C), temperature source Oral, resp. rate 20, height 5\' 5"  (1.651 m), weight 82.101 kg (181 lb), SpO2 98.00%. Body mass index is 30.12 kg/(m^2). Lab Results:   No results found for this or any previous visit (from the past 72 hour(s)).  Physical Findings: AIMS: Facial and Oral Movements Muscles of Facial Expression: None, normal Lips and Perioral Area: None, normal Jaw: None, normal Tongue: None, normal,Extremity Movements Upper (arms, wrists, hands, fingers): None, normal Lower (legs, knees, ankles, toes): None, normal, Trunk Movements Neck, shoulders, hips: None, normal, Overall Severity Severity of abnormal movements (highest score from questions above): None, normal Incapacitation due to abnormal movements: None, normal Patient's awareness of abnormal movements (rate only patient's report): No Awareness, Dental Status Current problems with teeth and/or dentures?: No Does patient usually wear dentures?: No  CIWA:  CIWA-Ar Total: 2 COWS:  COWS Total Score: 1  Psychiatric Specialty Exam: See Psychiatric Specialty Exam and Suicide Risk Assessment completed by Attending Physician prior to discharge.  Discharge destination:  Home  Is patient on multiple antipsychotic therapies at discharge:  No   Has Patient had three or more failed trials of antipsychotic monotherapy by history:  No  Recommended Plan for Multiple Antipsychotic Therapies: NA     Medication List    Notice   You have not been prescribed any medications.         Follow-up Information   Follow up with [facility] On 08/23/2013. (Walk in on this date for hospital discharge appointment. Walk in clinic is Monday - Friday 8 am - 3 pm. They will than schedule you for medication management and therapy. )    Contact information:   [address] [place], [place] [address] Phone: [phone] Fax: [phone]      Follow-up  recommendations:  Activity:  As tolerated Diet:  heart healthy with low sodium.  Comments:  Take all medications as prescribed. Keep all follow-up appointments as scheduled.  Do not consume alcohol or use illegal drugs while on prescription medications. Report any adverse effects from your medications to your primary care provider promptly.  In the event of recurrent symptoms or worsening symptoms, call 911, a crisis hotline, or go to the nearest emergency department for evaluation.   Total Discharge Time:  Greater than 30 minutes.  Signed: Beau FannyWithrow, John C, FNP-BC 08/20/2013, 5:04 PM  Patient seen, Suicide Assessment Completed.  Disposition Plan Reviewed

## 2013-09-09 ENCOUNTER — Telehealth (HOSPITAL_BASED_OUTPATIENT_CLINIC_OR_DEPARTMENT_OTHER): Payer: Self-pay | Admitting: Emergency Medicine

## 2013-09-09 NOTE — Telephone Encounter (Signed)
Post ED Visit - Positive Culture Follow-up  Positive Chlamydia culture Treated with Rocephin and Zithromax, organism sensitive to the same and no further patient follow-up is required at this time. [facility] faxed  09/09/13 @ 1227. Attempt to contact patient on # provided in epic -- "customer not available". Will continue attempts to contact patient.  Jiles HaroldGammons, Kritika [facility] 09/09/2013, 12:28 PM

## 2013-09-10 ENCOUNTER — Telehealth (HOSPITAL_BASED_OUTPATIENT_CLINIC_OR_DEPARTMENT_OTHER): Payer: Self-pay | Admitting: Emergency Medicine

## 2013-09-11 ENCOUNTER — Telehealth (HOSPITAL_BASED_OUTPATIENT_CLINIC_OR_DEPARTMENT_OTHER): Payer: Self-pay | Admitting: Emergency Medicine

## 2013-09-11 NOTE — Telephone Encounter (Signed)
Unable to contact patient via phone. Sent letter. °

## 2013-10-04 ENCOUNTER — Telehealth (HOSPITAL_COMMUNITY): Payer: Self-pay

## 2013-10-04 NOTE — ED Notes (Signed)
Unable to contact pt by mail or telephone. Unable to communicate lab results or treatment changes. 

## 2015-06-24 ENCOUNTER — Emergency Department (HOSPITAL_COMMUNITY)
Admission: EM | Admit: 2015-06-24 | Discharge: 2015-06-25 | Disposition: A | Discharge status: Home / Self Care | Payer: Self-pay | Attending: Emergency Medicine | Admitting: Emergency Medicine

## 2015-06-24 ENCOUNTER — Encounter (HOSPITAL_COMMUNITY): Payer: Self-pay

## 2015-06-24 DIAGNOSIS — E1165 Type 2 diabetes mellitus with hyperglycemia: Secondary | ICD-10-CM | POA: Insufficient documentation

## 2015-06-24 DIAGNOSIS — F17210 Nicotine dependence, cigarettes, uncomplicated: Secondary | ICD-10-CM | POA: Insufficient documentation

## 2015-06-24 DIAGNOSIS — R739 Hyperglycemia, unspecified: Secondary | ICD-10-CM

## 2015-06-24 HISTORY — DX: Type 2 diabetes mellitus without complications: E11.9

## 2015-06-24 LAB — CBC
HCT: 43.8 % (ref 39.0–52.0)
Hemoglobin: 15.4 g/dL (ref 13.0–17.0)
MCH: 31.4 pg (ref 26.0–34.0)
MCHC: 35.2 g/dL (ref 30.0–36.0)
MCV: 89.4 fL (ref 78.0–100.0)
Platelets: 210 10*3/uL (ref 150–400)
RBC: 4.9 MIL/uL (ref 4.22–5.81)
RDW: 12.7 % (ref 11.5–15.5)
WBC: 7.9 10*3/uL (ref 4.0–10.5)

## 2015-06-24 LAB — URINE MICROSCOPIC-ADD ON: Bacteria, UA: NONE SEEN

## 2015-06-24 LAB — BASIC METABOLIC PANEL
ANION GAP: 13 (ref 5–15)
BUN: 10 mg/dL (ref 6–20)
CALCIUM: 9.7 mg/dL (ref 8.9–10.3)
CO2: 24 mmol/L (ref 22–32)
Chloride: 95 mmol/L — ABNORMAL LOW (ref 101–111)
Creatinine, Ser: 1.18 mg/dL (ref 0.61–1.24)
GFR calc Af Amer: >60 mL/min (ref >60)
GFR calc non Af Amer: >60 mL/min (ref >60)
Potassium: 4.2 mmol/L (ref 3.5–5.1)
Sodium: 132 mmol/L — ABNORMAL LOW (ref 135–145)

## 2015-06-24 LAB — URINALYSIS, ROUTINE W REFLEX MICROSCOPIC
BILIRUBIN URINE: NEGATIVE
Glucose, UA: >1000 mg/dL — AB
Ketones, ur: NEGATIVE mg/dL
Leukocytes, UA: NEGATIVE
Nitrite: NEGATIVE
PH: 5.5 (ref 5.0–8.0)
Protein, ur: NEGATIVE mg/dL
Specific Gravity, Urine: 1.044 — ABNORMAL HIGH (ref 1.005–1.030)

## 2015-06-24 LAB — CBG MONITORING, ED
GLUCOSE-CAPILLARY: 536 mg/dL — AB (ref 65–99)
Glucose-Capillary: 385 mg/dL — ABNORMAL HIGH (ref 65–99)
Glucose-Capillary: 332 mg/dL — ABNORMAL HIGH (ref 65–99)

## 2015-06-24 MED ORDER — SODIUM CHLORIDE 0.9 % IV BOLUS (SEPSIS)
1000.0000 mL | Freq: Once | INTRAVENOUS | Status: AC
  Administered 2015-06-24: 1000 mL via INTRAVENOUS

## 2015-06-24 MED ORDER — INSULIN ASPART 100 UNIT/ML ~~LOC~~ SOLN
12.0000 [IU] | Freq: Once | SUBCUTANEOUS | Status: AC
  Administered 2015-06-24: 12 [IU] via INTRAVENOUS
  Filled 2015-06-24: qty 1

## 2015-06-24 NOTE — ED Notes (Signed)
Patient here due to blood sugar running greater than 500 today. Has not taken diabetic meds for several months. Complains of dizziness earlier today. No distress

## 2015-06-25 MED ORDER — METFORMIN HCL 500 MG PO TABS: 500.0000 mg | ORAL_TABLET | Freq: Two times a day (BID) | ORAL | Status: DC

## 2015-06-25 NOTE — ED Notes (Signed)
Pt verbalized understanding of discharge instructions and follow up care

## 2015-06-25 NOTE — ED Provider Notes (Signed)
CSN: [id number]     Arrival date & time 06/24/15  1729 History   First MD Initiated Contact with Patient 06/24/15 1929     Chief Complaint  Patient presents with  . Hyperglycemia     (Consider location/radiation/quality/duration/timing/severity/associated sxs/prior Treatment) Patient is a 35 y.o. male presenting with hyperglycemia.  Hyperglycemia Blood sugar level PTA:  500s Severity:  Severe Onset quality:  Unable to specify Duration:  1 day Timing:  Constant Progression:  Unchanged Chronicity:  Recurrent Current diabetic treatments: uncontrolled, not taking medication for several months. Context: not recent change in diet and not recent illness   Context comment:  Drinking multiple sodas today, ate bfast pizza Relieved by:  Nothing Ineffective treatments:  None tried Associated symptoms: increased thirst   Associated symptoms: no abdominal pain, no chest pain, no diaphoresis, no fever, no nausea, no shortness of breath and no vomiting   Risk factors: no hx of DKA     Past Medical History  Diagnosis Date  . Diabetes mellitus without complication (HCC)    History reviewed. No pertinent past surgical history. No family history on file. Social History  Substance Use Topics  . Smoking status: Current Every Day Smoker -- 0.25 packs/day for 15 years    Types: Cigarettes  . Smokeless tobacco: None  . Alcohol Use: Yes     Comment: occ    Review of Systems  Constitutional: Negative for fever and diaphoresis.  HENT: Negative for sore throat.   Eyes: Negative for visual disturbance.  Respiratory: Negative for shortness of breath.   Cardiovascular: Negative for chest pain.  Gastrointestinal: Negative for nausea, vomiting and abdominal pain.  Endocrine: Positive for polydipsia.  Genitourinary: Negative for difficulty urinating.  Musculoskeletal: Negative for back pain and neck stiffness.  Skin: Negative for rash.  Neurological: Positive for light-headedness. Negative for  syncope and headaches.      Allergies  Review of patient's allergies indicates no known allergies.  Home Medications   Prior to Admission medications   Medication Sig Start Date End Date Taking? Authorizing Provider  metFORMIN (GLUCOPHAGE) 500 MG tablet Take 1 tablet (500 mg total) by mouth 2 (two) times daily with a meal. 06/25/15   Alvira Monday, MD   BP 133/94 mmHg  Pulse 74  Temp(Src) 98.5 F (36.9 C)  Resp 18  SpO2 98% Physical Exam  Constitutional: He is oriented to person, place, and time. He appears well-developed and well-nourished. No distress.  HENT:  Head: Normocephalic and atraumatic.  Eyes: Conjunctivae and EOM are normal.  Neck: Normal range of motion.  Cardiovascular: Normal rate, regular rhythm, normal heart sounds and intact distal pulses.  Exam reveals no gallop and no friction rub.   No murmur heard. Pulmonary/Chest: Effort normal and breath sounds normal. No respiratory distress. He has no wheezes. He has no rales.  Abdominal: Soft. He exhibits no distension. There is no tenderness. There is no guarding.  Musculoskeletal: He exhibits no edema.  Neurological: He is alert and oriented to person, place, and time.  Skin: Skin is warm and dry. He is not diaphoretic.  Nursing note and vitals reviewed.   ED Course  Procedures (including critical care time) Labs Review Labs Reviewed  BASIC METABOLIC PANEL - Abnormal; Notable for the following:    Sodium 132 (*)    Chloride 95 (*)    All other components within normal limits  URINALYSIS, ROUTINE W REFLEX MICROSCOPIC (NOT AT [facility]) - Abnormal; Notable for the following:    Specific Gravity, Urine  1.044 (*)    Glucose, UA >1000 (*)    Hgb urine dipstick SMALL (*)    All other components within normal limits  URINE MICROSCOPIC-ADD ON - Abnormal; Notable for the following:    Squamous Epithelial / LPF 0-5 (*)    All other components within normal limits  CBG MONITORING, ED - Abnormal; Notable for the  following:    Glucose-Capillary 536 (*)    All other components within normal limits  CBG MONITORING, ED - Abnormal; Notable for the following:    Glucose-Capillary 385 (*)    All other components within normal limits  CBG MONITORING, ED - Abnormal; Notable for the following:    Glucose-Capillary 332 (*)    All other components within normal limits  CBC    Imaging Review No results found. I have personally reviewed and evaluated these images and lab results as part of my medical decision-making.   EKG Interpretation None      MDM   Final diagnoses:  Hyperglycemia   35 year old male with history of diabetes presents with concern for hyperglycemia. He should and has not taken his medications for several months, reports eating pizza and drinking soda prior to arrival. Labs are obtained which showed no sign of DKA. Patient with hyperglycemia and 500s, was given IV insulin with improvement of glucose down to 330.  Discussed importance of taking medications, diet and exercise for diabetes controlled with patient in detail. Patient does not have a primary care physician, and case management request was placed to palpation find PCP. In addition, he is given the number to contact community health and wellness. Patient reports he may have some metformin at home, however a prescription for 500 mg twice a day was provided in case. Patient discharged in stable condition with understanding of reasons to return.    Alvira MondayErin Caitrin Pendergraph, MD 06/25/15 [phone]

## 2015-06-25 NOTE — Discharge Instructions (Signed)
Hyperglycemia °Hyperglycemia occurs when the glucose (sugar) in your blood is too high. Hyperglycemia can happen for many reasons, but it most often happens to people who do not know they have diabetes or are not managing their diabetes properly.  °CAUSES  °Whether you have diabetes or not, there are other causes of hyperglycemia. Hyperglycemia can occur when you have diabetes, but it can also occur in other situations that you might not be as aware of, such as: °Diabetes °· If you have diabetes and are having problems controlling your blood glucose, hyperglycemia could occur because of some of the following reasons: °¨ Not following your meal plan. °¨ Not taking your diabetes medications or not taking it properly. °¨ Exercising less or doing less activity than you normally do. °¨ Being sick. °Pre-diabetes °· This cannot be ignored. Before people develop Type 2 diabetes, they almost always have "pre-diabetes." This is when your blood glucose levels are higher than normal, but not yet high enough to be diagnosed as diabetes. Research has shown that some long-term damage to the body, especially the heart and circulatory system, may already be occurring during pre-diabetes. If you take action to manage your blood glucose when you have pre-diabetes, you may delay or prevent Type 2 diabetes from developing. °Stress °· If you have diabetes, you may be "diet" controlled or on oral medications or insulin to control your diabetes. However, you may find that your blood glucose is higher than usual in the hospital whether you have diabetes or not. This is often referred to as "stress hyperglycemia." Stress can elevate your blood glucose. This happens because of hormones put out by the body during times of stress. If stress has been the cause of your high blood glucose, it can be followed regularly by your caregiver. That way he/she can make sure your hyperglycemia does not continue to get worse or progress to  diabetes. °Steroids °· Steroids are medications that act on the infection fighting system (immune system) to block inflammation or infection. One side effect can be a rise in blood glucose. Most people can produce enough extra insulin to allow for this rise, but for those who cannot, steroids make blood glucose levels go even higher. It is not unusual for steroid treatments to "uncover" diabetes that is developing. It is not always possible to determine if the hyperglycemia will go away after the steroids are stopped. A special blood test called an A1c is sometimes done to determine if your blood glucose was elevated before the steroids were started. °SYMPTOMS °· Thirsty. °· Frequent urination. °· Dry mouth. °· Blurred vision. °· Tired or fatigue. °· Weakness. °· Sleepy. °· Tingling in feet or leg. °DIAGNOSIS  °Diagnosis is made by monitoring blood glucose in one or all of the following ways: °· A1c test. This is a chemical found in your blood. °· Fingerstick blood glucose monitoring. °· Laboratory results. °TREATMENT  °First, knowing the cause of the hyperglycemia is important before the hyperglycemia can be treated. Treatment may include, but is not be limited to: °· Education. °· Change or adjustment in medications. °· Change or adjustment in meal plan. °· Treatment for an illness, infection, etc. °· More frequent blood glucose monitoring. °· Change in exercise plan. °· Decreasing or stopping steroids. °· Lifestyle changes. °HOME CARE INSTRUCTIONS  °· Test your blood glucose as directed. °· Exercise regularly. Your caregiver will give you instructions about exercise. Pre-diabetes or diabetes which comes on with stress is helped by exercising. °· Eat wholesome,   balanced meals. Eat often and at regular, fixed times. Your caregiver or nutritionist will give you a meal plan to guide your sugar intake. °· Being at an ideal weight is important. If needed, losing as little as 10 to 15 pounds may help improve blood  glucose levels. °SEEK MEDICAL CARE IF:  °· You have questions about medicine, activity, or diet. °· You continue to have symptoms (problems such as increased thirst, urination, or weight gain). °SEEK IMMEDIATE MEDICAL CARE IF:  °· You are vomiting or have diarrhea. °· Your breath smells fruity. °· You are breathing faster or slower. °· You are very sleepy or incoherent. °· You have numbness, tingling, or pain in your feet or hands. °· You have chest pain. °· Your symptoms get worse even though you have been following your caregiver's orders. °· If you have any other questions or concerns. °  °This information is not intended to replace advice given to you by your health care provider. Make sure you discuss any questions you have with your health care provider. °  °Document Released: 07/10/2000 Document Revised: 04/08/2011 Document Reviewed: 09/20/2014 °Elsevier Interactive Patient Education ©2016 Elsevier Inc. ° °

## 2019-06-07 ENCOUNTER — Ambulatory Visit (INDEPENDENT_AMBULATORY_CARE_PROVIDER_SITE_OTHER): Payer: Self-pay | Admitting: Pharmacist

## 2019-06-07 ENCOUNTER — Other Ambulatory Visit: Payer: Self-pay

## 2019-06-07 DIAGNOSIS — Z79899 Other long term (current) drug therapy: Secondary | ICD-10-CM

## 2019-06-07 DIAGNOSIS — Z113 Encounter for screening for infections with a predominantly sexual mode of transmission: Secondary | ICD-10-CM

## 2019-06-07 DIAGNOSIS — Z7252 High risk homosexual behavior: Secondary | ICD-10-CM

## 2019-06-07 NOTE — Progress Notes (Signed)
Date:  06/07/2019   HPI: Lawrence Mccormick is a 39 y.o. male who presents to the [facility] to discuss and initiate PrEP.  Insured   []    Uninsured  [x]    Patient Active Problem List   Diagnosis Date Noted  . Hyperglycemia 08/14/2013  . Major depressive disorder, recurrent episode, severe, without mention of psychotic behavior 08/13/2013  . Suicidal ideations 08/13/2013  . Severe recurrent major depression without psychotic features ([place]) 08/13/2013    Patient's Medications  New Prescriptions   No medications on file  Previous Medications   METFORMIN (GLUCOPHAGE) 500 MG TABLET    Take 1 tablet (500 mg total) by mouth 2 (two) times daily with a meal.  Modified Medications   No medications on file  Discontinued Medications   No medications on file    Allergies: No Known Allergies  Past Medical History: Past Medical History:  Diagnosis Date  . Diabetes mellitus without complication [facility])     Social History: Social History   Socioeconomic History  . Marital status: Married    Spouse name: Not on file  . Number of children: Not on file  . Years of education: Not on file  . Highest education level: Not on file  Occupational History  . Not on file  Tobacco Use  . Smoking status: Current Every Day Smoker    Packs/day: 0.25    Years: 15.00    Pack years: 3.75    Types: Cigarettes  Substance and Sexual Activity  . Alcohol use: Yes    Comment: occ  . Drug use: No  . Sexual activity: Not on file  Other Topics Concern  . Not on file  Social History Narrative  . Not on file   Social Determinants of Health   Financial Resource Strain:   . Difficulty of Paying Living Expenses:   Food Insecurity:   . Worried About Charity fundraiser in the Last Year:   . Arboriculturist in the Last Year:   Transportation Needs:   . Film/video editor (Medical):   Marland Kitchen Lack of Transportation (Non-Medical):   Physical Activity:   . Days of Exercise per Week:   .  Minutes of Exercise per Session:   Stress:   . Feeling of Stress :   Social Connections:   . Frequency of Communication with Friends and Family:   . Frequency of Social Gatherings with Friends and Family:   . Attends Religious Services:   . Active Member of Clubs or Organizations:   . Attends Archivist Meetings:   Marland Kitchen Marital Status:     No flowsheet data found.  Labs:  SCr: Lab Results  Component Value Date   CREATININE 1.18 06/24/2015   CREATININE 1.06 08/14/2013   CREATININE 0.77 08/13/2013   HIV No results found for: HIV Hepatitis B No results found for: HEPBSAB, HEPBSAG, HEPBCAB Hepatitis C No results found for: HEPCAB, HCVRNAPCRQN Hepatitis A No results found for: HAV RPR and STI No results found for: LABRPR, RPRTITER  STI Results GC CT  Latest Ref Rng & Units NEGATIVE NEGATIVE  09/05/2013 NEGATIVE POSITIVE(A)    Assessment: Lawrence Mccormick presents today to initiate PrEP. Risk factors include MSM and HIV+ partner. Patient does not routinely use condoms with his partner. If HIV antibody negative, will plan to start patient on Descovy 1 tablet once daily with or without food. Discussed importance of medication adherence and counseled patient on potential side-effects. Encouraged condom use to  decrease the risk of STDs and HIV transmission. Patient is uninsured and will apply for [facility] patient assistance for medication coverage.  Plan: - HIV antibody, BMET, RPR, Hepatitis serologies, urine gonorrhea/chlamydia today - Descovy x 3 months if HIV negative - F/u with Cassie in 3 months on 8/3  Fabio Neighbors, PharmD PGY1 Ambulatory Care Resident [facility] 06/07/2019, 11:59 AM

## 2019-06-08 LAB — BASIC METABOLIC PANEL
BUN: 8 mg/dL (ref 7–25)
CO2: 30 mmol/L (ref 20–32)
Calcium: 9.7 mg/dL (ref 8.6–10.3)
Chloride: 96 mmol/L — ABNORMAL LOW (ref 98–110)
Creat: 0.85 mg/dL (ref 0.60–1.35)
Glucose, Bld: 346 mg/dL — ABNORMAL HIGH (ref 65–99)
Potassium: 4.1 mmol/L (ref 3.5–5.3)
Sodium: 135 mmol/L (ref 135–146)

## 2019-06-08 LAB — URINE CYTOLOGY ANCILLARY ONLY
Chlamydia: NEGATIVE
Comment: NEGATIVE
Comment: NORMAL
Neisseria Gonorrhea: NEGATIVE

## 2019-06-08 LAB — HEPATITIS B SURFACE ANTIBODY,QUALITATIVE: Hep B S Ab: NONREACTIVE

## 2019-06-08 LAB — HEPATITIS A ANTIBODY, TOTAL: Hepatitis A AB,Total: REACTIVE — AB

## 2019-06-08 LAB — HEPATITIS C ANTIBODY
Hepatitis C Ab: NONREACTIVE
SIGNAL TO CUT-OFF: 0.02 (ref <1.00)

## 2019-06-08 LAB — HIV ANTIBODY (ROUTINE TESTING W REFLEX): HIV 1&2 Ab, 4th Generation: NONREACTIVE

## 2019-06-08 LAB — RPR: RPR Ser Ql: NONREACTIVE

## 2019-06-08 LAB — HEPATITIS B SURFACE ANTIGEN: Hepatitis B Surface Ag: NONREACTIVE

## 2019-06-09 ENCOUNTER — Telehealth: Payer: Self-pay | Admitting: Pharmacy Technician

## 2019-06-09 ENCOUNTER — Other Ambulatory Visit: Payer: Self-pay | Admitting: Pharmacist

## 2019-06-09 ENCOUNTER — Telehealth: Payer: Self-pay | Admitting: Pharmacist

## 2019-06-09 DIAGNOSIS — Z7252 High risk homosexual behavior: Secondary | ICD-10-CM

## 2019-06-09 MED ORDER — DESCOVY 200-25 MG PO TABS: 1.0000 | ORAL_TABLET | Freq: Every day | ORAL | 2 refills | Status: DC

## 2019-06-09 NOTE — Telephone Encounter (Cosign Needed)
Contacted patient to let him know he is HIV negative and will send prescription of Descovy x 3 months to [facility] who will then ship medication to patient's home address. Also informed patient that his sugar on labs was elevated at 346 mg/dL in which patient expressed that he has not been taking his metformin. Encouraged patient to start taking his metformin everyday as prescribed and to contact his provider who prescribed metformin. Patient verbalized understanding.    Fabio Neighbors, PharmD PGY1 Ambulatory Care Resident

## 2019-06-09 NOTE — Progress Notes (Signed)
Patient's HIV antibody is negative.  Will send in 3 months of Descovy to [facility].  

## 2019-06-09 NOTE — Telephone Encounter (Signed)
Agree 

## 2019-06-09 NOTE — Telephone Encounter (Addendum)
[facility] Patient Advocate Encounter   Patient has been approved for [employer] Advancing Access Patient Assistance Program for Descovy. This assistance will make the patient's copay $0 until 06/07/2020.  I have spoken with the patient and they will have the medication mailed to their home address.  The billing information is:   Patient knows to call the office with questions or concerns.  Beulah Gandy, CPhT Specialty Pharmacy Patient [facility] Phone: [phone] Fax: [phone] 06/09/2019 11:12 AM

## 2019-08-31 ENCOUNTER — Ambulatory Visit: Payer: Self-pay | Admitting: Pharmacist

## 2019-09-08 ENCOUNTER — Ambulatory Visit: Payer: Self-pay | Admitting: Pharmacist

## 2019-12-03 ENCOUNTER — Ambulatory Visit: Payer: Self-pay | Admitting: Internal Medicine

## 2020-01-05 ENCOUNTER — Other Ambulatory Visit: Payer: Self-pay

## 2020-01-05 ENCOUNTER — Ambulatory Visit (INDEPENDENT_AMBULATORY_CARE_PROVIDER_SITE_OTHER): Payer: Self-pay | Admitting: Pharmacist

## 2020-01-05 DIAGNOSIS — Z113 Encounter for screening for infections with a predominantly sexual mode of transmission: Secondary | ICD-10-CM

## 2020-01-05 DIAGNOSIS — Z79899 Other long term (current) drug therapy: Secondary | ICD-10-CM

## 2020-01-05 DIAGNOSIS — Z7252 High risk homosexual behavior: Secondary | ICD-10-CM

## 2020-01-05 NOTE — Progress Notes (Signed)
Date:  01/05/2020   HPI: LINTON STOLP is a 39 y.o. male who presents to the [facility] clinic for HIV PrEP follow-up.  Insured   []    Uninsured  [x]    Patient Active Problem List   Diagnosis Date Noted  . Hyperglycemia 08/14/2013  . Major depressive disorder, recurrent episode, severe, without mention of psychotic behavior 08/13/2013  . Suicidal ideations 08/13/2013  . Severe recurrent major depression without psychotic features ([facility]) 08/13/2013    Patient's Medications  New Prescriptions   No medications on file  Previous Medications   EMTRICITABINE-TENOFOVIR AF (DESCOVY) 200-25 MG TABLET    Take 1 tablet by mouth daily.   METFORMIN (GLUCOPHAGE) 500 MG TABLET    Take 1 tablet (500 mg total) by mouth 2 (two) times daily with a meal.  Modified Medications   No medications on file  Discontinued Medications   No medications on file    Allergies: No Known Allergies  Past Medical History: Past Medical History:  Diagnosis Date  . Diabetes mellitus without complication [facility])     Social History: Social History   Socioeconomic History  . Marital status: Married    Spouse name: Not on file  . Number of children: Not on file  . Years of education: Not on file  . Highest education level: Not on file  Occupational History  . Not on file  Tobacco Use  . Smoking status: Current Every Day Smoker    Packs/day: 0.25    Years: 15.00    Pack years: 3.75    Types: Cigarettes  Substance and Sexual Activity  . Alcohol use: Yes    Comment: occ  . Drug use: No  . Sexual activity: Not on file  Other Topics Concern  . Not on file  Social History Narrative  . Not on file   Social Determinants of Health   Financial Resource Strain:   . Difficulty of Paying Living Expenses: Not on file  Food Insecurity:   . Worried About 08/15/2013 in the Last Year: Not on file  . Ran Out of Food in the Last Year: Not on file  Transportation Needs:   . Lack of Transportation  (Medical): Not on file  . Lack of Transportation (Non-Medical): Not on file  Physical Activity:   . Days of Exercise per Week: Not on file  . Minutes of Exercise per Session: Not on file  Stress:   . Feeling of Stress : Not on file  Social Connections:   . Frequency of Communication with Friends and Family: Not on file  . Frequency of Social Gatherings with Friends and Family: Not on file  . Attends Religious Services: Not on file  . Active Member of Clubs or Organizations: Not on file  . Attends [facility] Meetings: Not on file  . Marital Status: Not on file    No flowsheet data found.  Labs:  SCr: Lab Results  Component Value Date   CREATININE 0.85 06/07/2019   CREATININE 1.18 06/24/2015   CREATININE 1.06 08/14/2013   CREATININE 0.77 08/13/2013   HIV Lab Results  Component Value Date   HIV NON-REACTIVE 06/07/2019   Hepatitis B Lab Results  Component Value Date   HEPBSAB NON-REACTIVE 06/07/2019   HEPBSAG NON-REACTIVE 06/07/2019   Hepatitis C Lab Results  Component Value Date   HEPCAB NON-REACTIVE 06/07/2019   Hepatitis A Lab Results  Component Value Date   HAV REACTIVE (A) 06/07/2019   RPR and  STI Lab Results  Component Value Date   LABRPR NON-REACTIVE 06/07/2019    STI Results GC GC CT CT  Latest Ref Rng & Units - NEGATIVE - NEGATIVE  06/07/2019 Negative - Negative -  09/05/2013 - NEGATIVE - POSITIVE(A)    Assessment: Shanan is here today to follow up for PrEP. He was last seen in May but states that he has 3 pills left of his Descovy. 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  No issues today or s/sx of STIs or acute HIV. He has had several new partners, so I will check STIs today as well.  Will have him come in again in 3 months for lab work and see me again in 6 months.   Plan: - HIV antibody, RPR. BMET, urine cytology today - Descovy x 3 months if HIV negative - F/u for lab work 3/7 at 230pm - F/u  with me again 5/31 at 230pm  Zahmir Lalla L. Mulki Roesler, PharmD, BCIDP, AAHIVP, CPP Clinical Pharmacist Practitioner Infectious Diseases Clinical Pharmacist [facility] 01/05/2020, 2:17 PM

## 2020-01-06 ENCOUNTER — Telehealth: Payer: Self-pay

## 2020-01-06 ENCOUNTER — Other Ambulatory Visit: Payer: Self-pay | Admitting: Pharmacist

## 2020-01-06 DIAGNOSIS — Z7252 High risk homosexual behavior: Secondary | ICD-10-CM

## 2020-01-06 LAB — BASIC METABOLIC PANEL
BUN: 8 mg/dL (ref 7–25)
CO2: 34 mmol/L — ABNORMAL HIGH (ref 20–32)
Calcium: 10 mg/dL (ref 8.6–10.3)
Chloride: 94 mmol/L — ABNORMAL LOW (ref 98–110)
Creat: 0.97 mg/dL (ref 0.60–1.35)
Glucose, Bld: 451 mg/dL — ABNORMAL HIGH (ref 65–99)
Potassium: 4.7 mmol/L (ref 3.5–5.3)
Sodium: 134 mmol/L — ABNORMAL LOW (ref 135–146)

## 2020-01-06 LAB — URINE CYTOLOGY ANCILLARY ONLY
Chlamydia: NEGATIVE
Comment: NEGATIVE
Comment: NORMAL
Neisseria Gonorrhea: NEGATIVE

## 2020-01-06 LAB — RPR: RPR Ser Ql: NONREACTIVE

## 2020-01-06 LAB — HIV ANTIBODY (ROUTINE TESTING W REFLEX): HIV 1&2 Ab, 4th Generation: NONREACTIVE

## 2020-01-06 MED ORDER — DESCOVY 200-25 MG PO TABS: 1.0000 | ORAL_TABLET | Freq: Every day | ORAL | 2 refills | Status: DC

## 2020-01-06 NOTE — Telephone Encounter (Signed)
Received call from [place] at [facility]. Urgent results are blood glucose 451, drawn on 01/05/20 at 2:28pm, verified by repeat analysis. Labs are available in Epic. Annice Pih will also fax results, will route to Cassie Kuppelweiser, RPH-CPP.   Sandie Ano, RN

## 2020-01-06 NOTE — Telephone Encounter (Signed)
Spoke with patient, he denies any symptoms of hyperglycemia and states he feels fine. He mentions that he has been urinating frequently, but that is typical for him. He states he has not been taking his metformin. RN gave patient phone numbers to [facility] for him to establish primary care.   Sandie Ano, RN

## 2020-01-06 NOTE — Telephone Encounter (Signed)
Thank you, Megan.

## 2020-01-06 NOTE — Progress Notes (Signed)
Patient's HIV antibody is negative.  Will send in 3 more months of Descovy to [facility].  

## 2020-01-06 NOTE — Telephone Encounter (Addendum)
Attempted to call patient on both numbers listed in chart at 12pm, no answer and mailbox was full on cell. Will try again later.   Sandie Ano, RN

## 2020-01-11 ENCOUNTER — Other Ambulatory Visit: Payer: Self-pay | Admitting: Pharmacist

## 2020-01-11 DIAGNOSIS — Z7252 High risk homosexual behavior: Secondary | ICD-10-CM

## 2020-01-11 MED ORDER — DESCOVY 200-25 MG PO TABS: 1.0000 | ORAL_TABLET | Freq: Every day | ORAL | 5 refills | Status: DC

## 2020-01-25 MED FILL — DESCOVY 200-25 MG TABS: 200-25 | 30 days supply | Qty: 30 | Fill #0

## 2020-02-03 ENCOUNTER — Encounter: Payer: Self-pay | Admitting: Internal Medicine

## 2020-02-03 ENCOUNTER — Telehealth: Payer: Self-pay | Admitting: *Deleted

## 2020-02-03 NOTE — Telephone Encounter (Signed)
Pt was a no show for his appt today. Called pt - stated he did realize he had an appt - appt re-scheduled 1/20 @ 1345PM.

## 2020-02-17 ENCOUNTER — Telehealth: Payer: Self-pay

## 2020-02-17 ENCOUNTER — Encounter: Payer: Self-pay | Admitting: Internal Medicine

## 2020-02-17 NOTE — Progress Notes (Deleted)
   CC: ***  HPI:  [name] is a 40 y.o.   Past Medical History:  Diagnosis Date  . Diabetes mellitus without complication ([facility])    Review of Systems:  ***  Physical Exam:  There were no vitals filed for this visit. ***  Assessment & Plan:   See Encounters Tab for problem based charting.  Patient {GC/GE:3044014::"discussed with","seen with"} Dr. {NAMES:3044014::"Butcher","Guilloud","Hoffman","Mullen","Narendra","Raines","Vincent"}

## 2020-03-28 MED FILL — DESCOVY 200-25 MG TABS: 200-25 | 30 days supply | Qty: 30 | Fill #1

## 2020-04-03 ENCOUNTER — Other Ambulatory Visit: Payer: Self-pay

## 2020-04-28 ENCOUNTER — Other Ambulatory Visit (HOSPITAL_COMMUNITY): Payer: Self-pay

## 2020-05-04 ENCOUNTER — Other Ambulatory Visit (HOSPITAL_COMMUNITY): Payer: Self-pay

## 2020-05-08 ENCOUNTER — Other Ambulatory Visit (HOSPITAL_COMMUNITY): Payer: Self-pay

## 2020-05-29 ENCOUNTER — Other Ambulatory Visit (HOSPITAL_COMMUNITY): Payer: Self-pay

## 2020-05-29 ENCOUNTER — Telehealth: Payer: Self-pay

## 2020-05-29 NOTE — Telephone Encounter (Signed)
.  [facility] Patient Advocate Encounter  [facility] and I have been unsuccsessful in reaching patient to be able to refill medication.    We have tried multiple times without a response.  Patient Advancing Access enrollment ends on 06/07/20. I will need income verification from patient.   Clearance Coots, CPhT Specialty Pharmacy Patient [facility] Phone: [phone] Fax:  [phone]

## 2020-06-27 ENCOUNTER — Ambulatory Visit: Payer: Self-pay | Admitting: Pharmacist

## 2020-07-01 ENCOUNTER — Inpatient Hospital Stay (HOSPITAL_COMMUNITY)
Admission: EM | Admit: 2020-07-01 | Discharge: 2020-07-03 | DRG: 440 | Disposition: A | Discharge status: Home / Self Care | Payer: Self-pay | Attending: Internal Medicine | Admitting: Internal Medicine

## 2020-07-01 ENCOUNTER — Other Ambulatory Visit: Payer: Self-pay

## 2020-07-01 ENCOUNTER — Emergency Department (HOSPITAL_COMMUNITY): Payer: Self-pay

## 2020-07-01 DIAGNOSIS — F17210 Nicotine dependence, cigarettes, uncomplicated: Secondary | ICD-10-CM | POA: Diagnosis present

## 2020-07-01 DIAGNOSIS — Z7984 Long term (current) use of oral hypoglycemic drugs: Secondary | ICD-10-CM

## 2020-07-01 DIAGNOSIS — Z9049 Acquired absence of other specified parts of digestive tract: Secondary | ICD-10-CM

## 2020-07-01 DIAGNOSIS — E1165 Type 2 diabetes mellitus with hyperglycemia: Secondary | ICD-10-CM | POA: Diagnosis present

## 2020-07-01 DIAGNOSIS — Z79899 Other long term (current) drug therapy: Secondary | ICD-10-CM

## 2020-07-01 DIAGNOSIS — K859 Acute pancreatitis without necrosis or infection, unspecified: Principal | ICD-10-CM | POA: Diagnosis present

## 2020-07-01 DIAGNOSIS — R17 Unspecified jaundice: Principal | ICD-10-CM | POA: Diagnosis present

## 2020-07-01 DIAGNOSIS — Z20822 Contact with and (suspected) exposure to covid-19: Secondary | ICD-10-CM | POA: Diagnosis present

## 2020-07-01 DIAGNOSIS — I160 Hypertensive urgency: Secondary | ICD-10-CM | POA: Diagnosis present

## 2020-07-01 DIAGNOSIS — Z9114 Patient's other noncompliance with medication regimen: Secondary | ICD-10-CM

## 2020-07-01 DIAGNOSIS — I10 Essential (primary) hypertension: Secondary | ICD-10-CM | POA: Diagnosis present

## 2020-07-01 LAB — LIPASE, BLOOD: Lipase: 527 U/L — ABNORMAL HIGH (ref 11–51)

## 2020-07-01 LAB — CBC WITH DIFFERENTIAL/PLATELET
Abs Immature Granulocytes: 0.04 10*3/uL (ref 0.00–0.07)
Basophils Absolute: 0 10*3/uL (ref 0.0–0.1)
Basophils Relative: 0 %
Eosinophils Absolute: 0 10*3/uL (ref 0.0–0.5)
Eosinophils Relative: 0 %
HCT: 48.4 % (ref 39.0–52.0)
Hemoglobin: 16.5 g/dL (ref 13.0–17.0)
Immature Granulocytes: 1 %
Lymphocytes Relative: 15 %
Lymphs Abs: 1.4 10*3/uL (ref 0.7–4.0)
MCH: 31.7 pg (ref 26.0–34.0)
MCHC: 34.1 g/dL (ref 30.0–36.0)
MCV: 93.1 fL (ref 80.0–100.0)
Monocytes Absolute: 0.7 10*3/uL (ref 0.1–1.0)
Monocytes Relative: 8 %
Neutro Abs: 6.7 10*3/uL (ref 1.7–7.7)
Neutrophils Relative %: 76 %
Platelets: 242 10*3/uL (ref 150–400)
RBC: 5.2 MIL/uL (ref 4.22–5.81)
RDW: 12.3 % (ref 11.5–15.5)
WBC: 8.9 10*3/uL (ref 4.0–10.5)
nRBC: 0 % (ref 0.0–0.2)

## 2020-07-01 LAB — COMPREHENSIVE METABOLIC PANEL
ALT: 201 U/L — ABNORMAL HIGH (ref 0–44)
AST: 134 U/L — ABNORMAL HIGH (ref 15–41)
Albumin: 3.8 g/dL (ref 3.5–5.0)
Alkaline Phosphatase: 583 U/L — ABNORMAL HIGH (ref 38–126)
Anion gap: 17 — ABNORMAL HIGH (ref 5–15)
BUN: 21 mg/dL — ABNORMAL HIGH (ref 6–20)
CO2: 27 mmol/L (ref 22–32)
Calcium: 9.7 mg/dL (ref 8.9–10.3)
Chloride: 87 mmol/L — ABNORMAL LOW (ref 98–111)
Creatinine, Ser: 0.68 mg/dL (ref 0.61–1.24)
GFR, Estimated: >60 mL/min (ref >60)
Glucose, Bld: 352 mg/dL — ABNORMAL HIGH (ref 70–99)
Potassium: 4.5 mmol/L (ref 3.5–5.1)
Sodium: 131 mmol/L — ABNORMAL LOW (ref 135–145)
Total Bilirubin: 11.5 mg/dL — ABNORMAL HIGH (ref 0.3–1.2)
Total Protein: 8.2 g/dL — ABNORMAL HIGH (ref 6.5–8.1)

## 2020-07-01 LAB — CBG MONITORING, ED: Glucose-Capillary: 383 mg/dL — ABNORMAL HIGH (ref 70–99)

## 2020-07-01 LAB — RESP PANEL BY RT-PCR (FLU A&B, COVID) ARPGX2
Influenza A by PCR: NEGATIVE
Influenza B by PCR: NEGATIVE
SARS Coronavirus 2 by RT PCR: NEGATIVE

## 2020-07-01 IMAGING — US US ABDOMEN LIMITED
1 series · 15 of 25 positions shown · non-contrast
Comparison: None.

CLINICAL DATA: RIGHT upper quadrant pain.

EXAM:
ULTRASOUND ABDOMEN LIMITED RIGHT UPPER QUADRANT

[Series 1: us abdomen limited mc & wl · 15 of 48 slices shown]
[im 1/48]
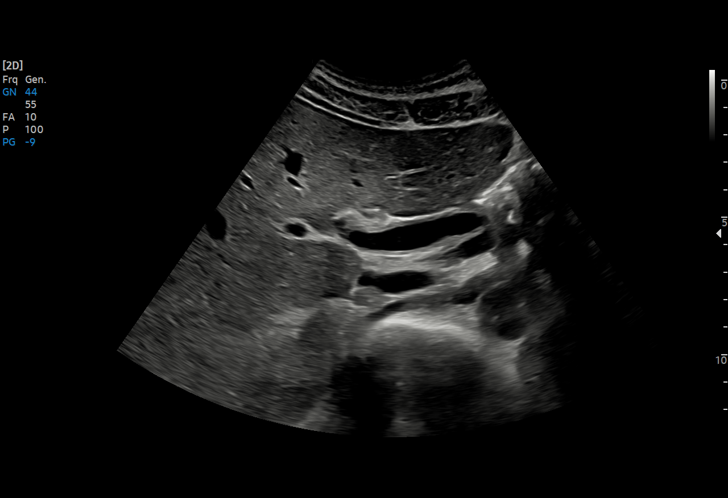
[im 4/48]
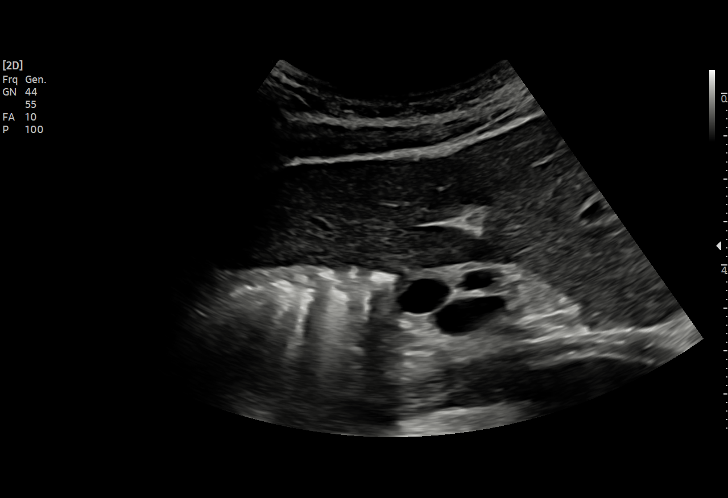
[im 8/48]
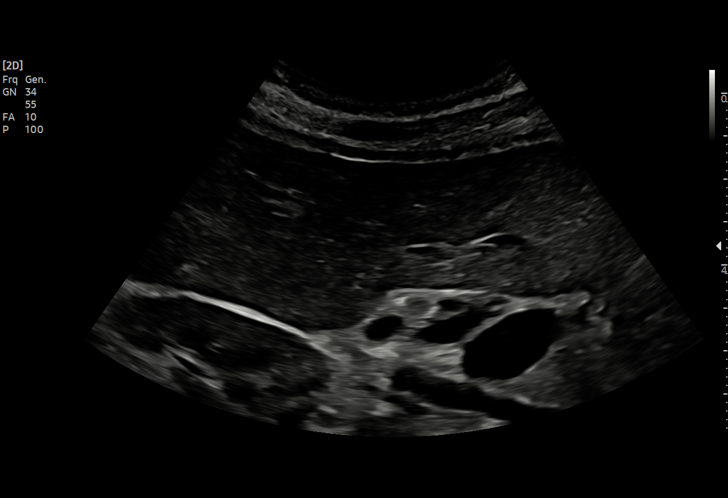
[im 10/48]
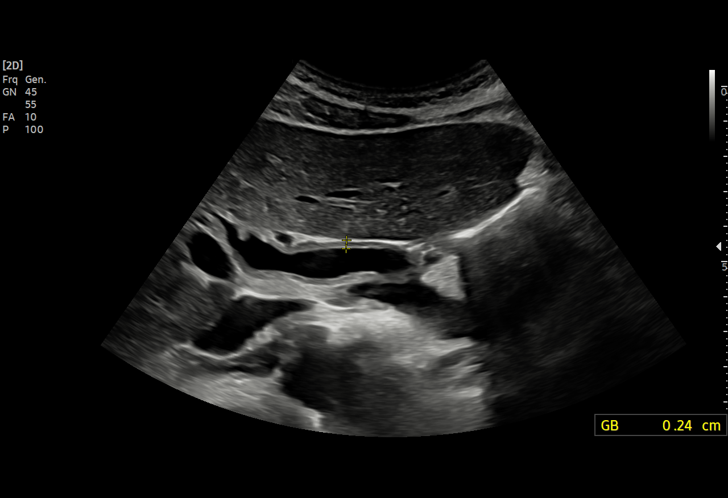
[im 14/48]
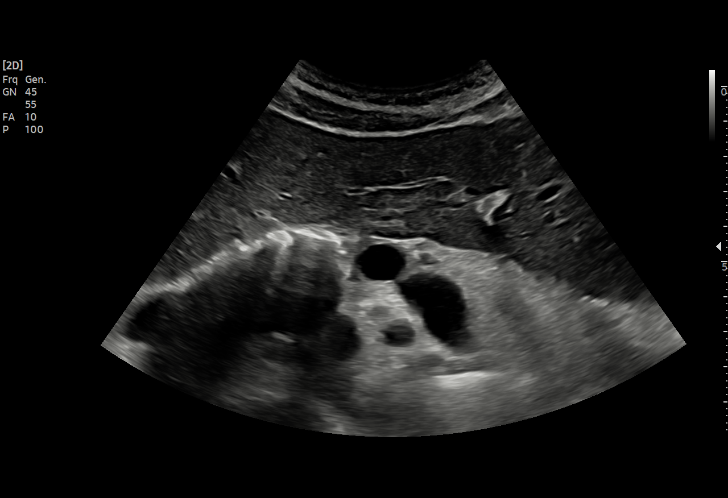
[im 18/48]
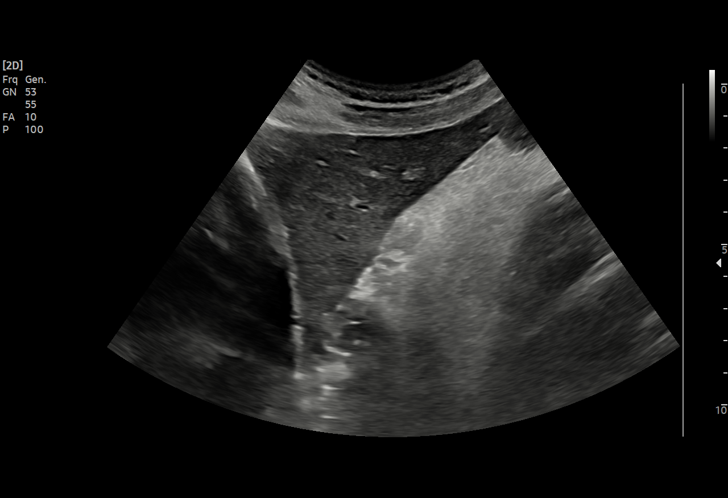
[im 20/48]
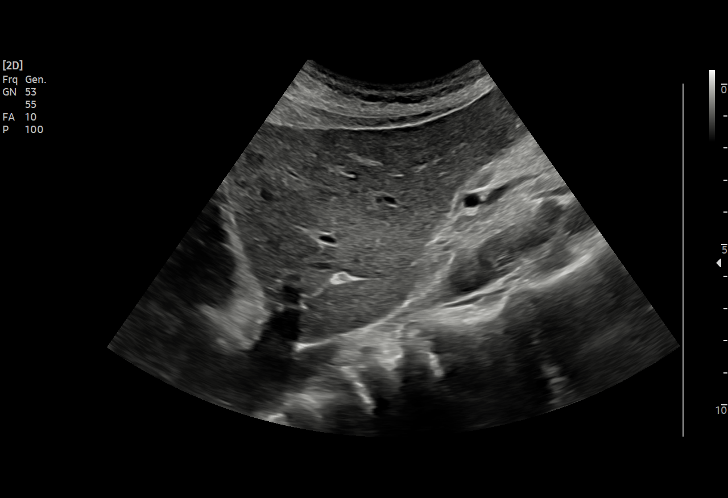
[im 24/48]
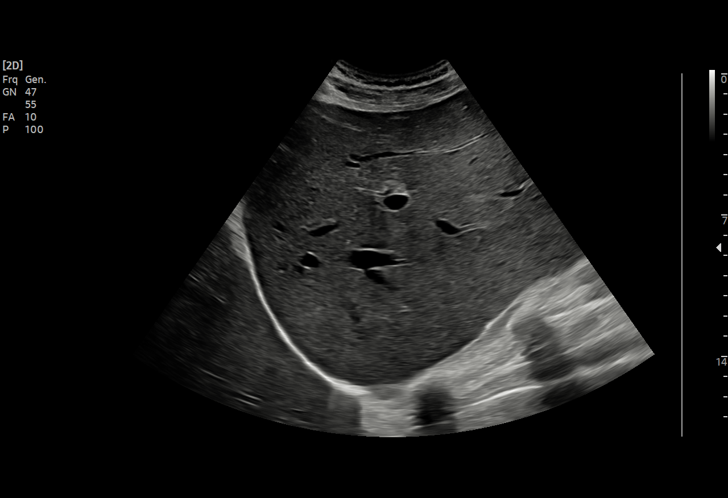
[im 28/48]
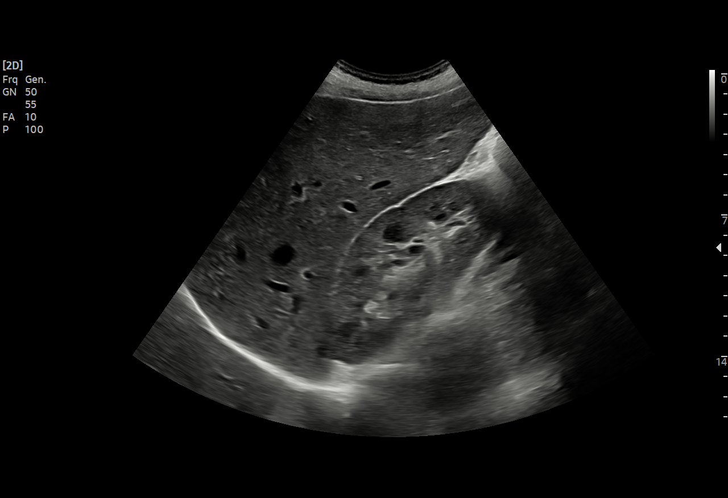
[im 30/48]
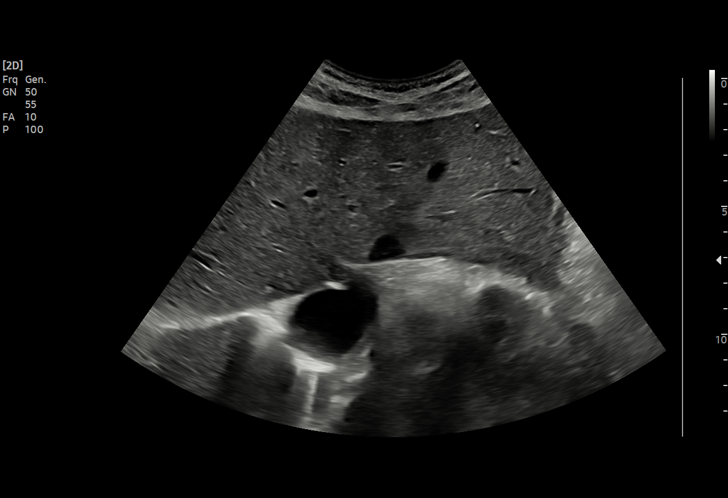
[im 34/48]
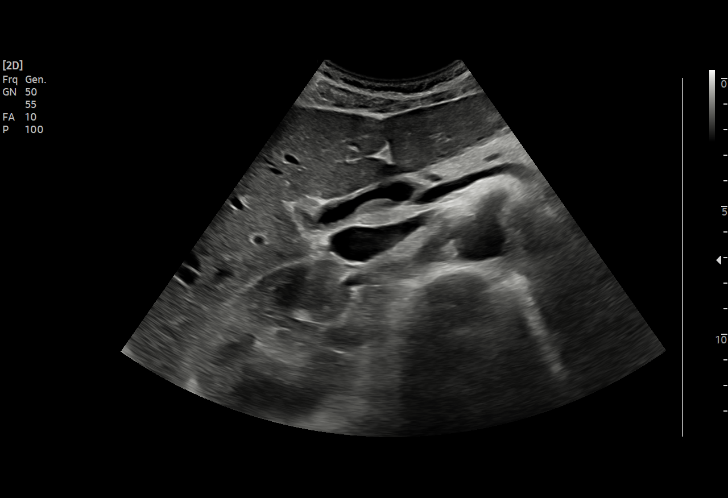
[im 38/48]
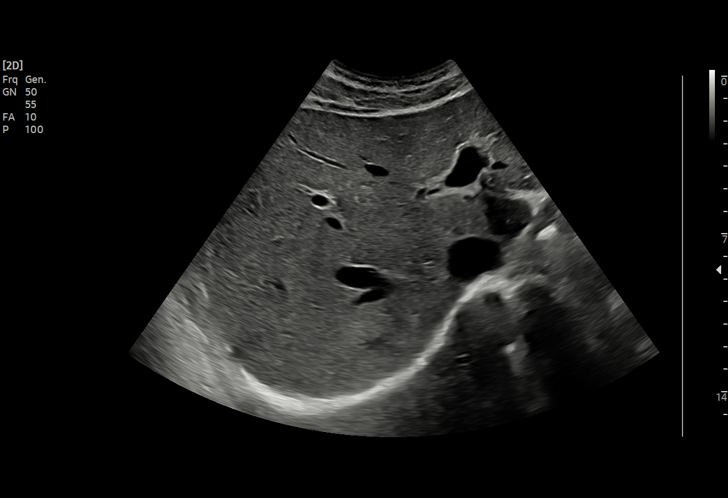
[im 40/48]
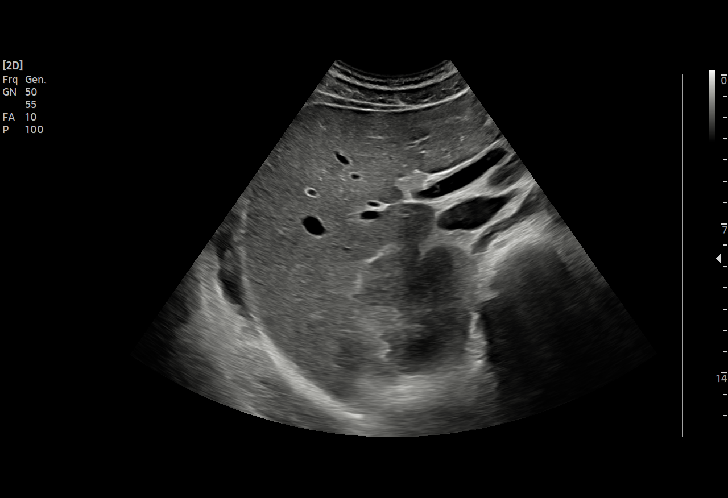
[im 44/48]
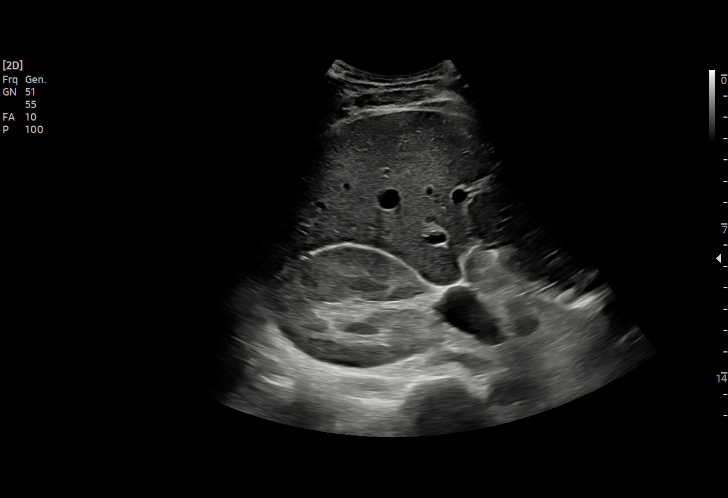
[im 48/48]
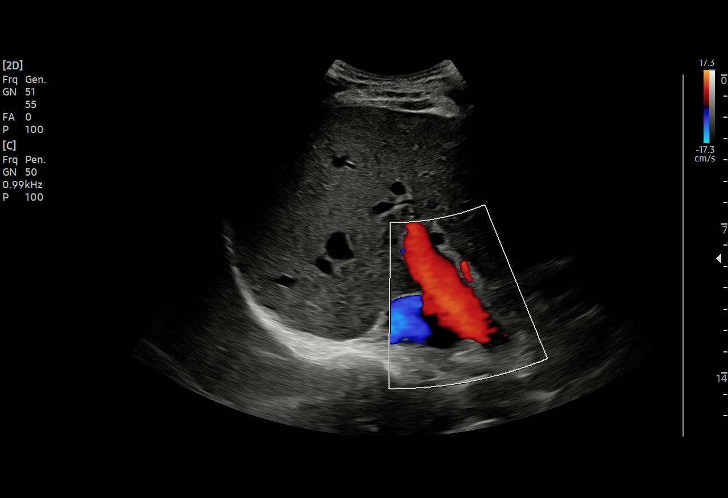

[15 of 25 positions shown; findings below may reference images not displayed]

FINDINGS: Gallbladder:

No gallstones or wall thickening visualized. No sonographic Murphy
sign noted by sonographer.

Common bile duct:

Diameter: 6 mm

Liver:

No focal lesion identified. Within normal limits in parenchymal
echogenicity. Portal vein is patent on color Doppler imaging with
normal direction of blood flow towards the liver.

Other: None.
IMPRESSION: Normal RIGHT upper quadrant ultrasound.

## 2020-07-01 MED ORDER — MORPHINE SULFATE (PF) 4 MG/ML IV SOLN
6.0000 mg | Freq: Once | INTRAVENOUS | Status: AC
  Administered 2020-07-01: 6 mg via INTRAVENOUS
  Filled 2020-07-01: qty 2

## 2020-07-01 MED ORDER — ONDANSETRON HCL 4 MG/2ML IJ SOLN
4.0000 mg | Freq: Once | INTRAMUSCULAR | Status: AC
  Administered 2020-07-01: 4 mg via INTRAVENOUS
  Filled 2020-07-01: qty 2

## 2020-07-01 MED ORDER — LACTATED RINGERS IV BOLUS
1000.0000 mL | Freq: Once | INTRAVENOUS | Status: AC
  Administered 2020-07-01: 1000 mL via INTRAVENOUS

## 2020-07-01 MED ORDER — LACTATED RINGERS IV SOLN: INTRAVENOUS | Status: DC

## 2020-07-01 NOTE — ED Triage Notes (Signed)
Patient reports vomiting x 3 days.denies diarrhea/congestion. Says his stomach hurts from vomiting pain rated 10/10. Patient CBG in triage 383. Patient says he does not check cbg regularly.

## 2020-07-01 NOTE — ED Provider Notes (Signed)
Emergency Medicine Provider Triage Evaluation Note  Lawrence Mccormick , a 40 y.o. male  was evaluated in triage.  Pt complains of 3-day history of vomiting and abdominal pain.  Denies any changes to bowel movements or urination.  No fever.  Review of Systems  Positive: Abdominal pain, vomiting Negative: Fever, urinary or bowel symptoms  Physical Exam  BP (!) 163/110 (BP Location: Right Arm)   Pulse 78   Temp 99 F (37.2 C) (Oral)   Resp 16   Ht 5\' 9"  (1.753 m)   SpO2 99%   BMI 26.73 kg/m  Gen:   Awake, no distress   Resp:  Normal effort  MSK:   Moves extremities without difficulty  Other:  Generalized tenderness  Medical Decision Making  Medically screening exam initiated at 6:10 PM.  Appropriate orders placed.  Lawrence Mccormick was informed that the remainder of the evaluation will be completed by another provider, this initial triage assessment does not replace that evaluation, and the importance of remaining in the ED until their evaluation is complete.  Lab work ordered   Angela Cox, PA-C 07/01/20 1811    08/31/20, MD 07/02/20 [id number]

## 2020-07-01 NOTE — ED Provider Notes (Signed)
[facility]-EMERGENCY DEPT Provider Note   CSN: [id number] Arrival date & time: 07/01/20  1739     History Chief Complaint  Patient presents with  . Emesis    DONOLD MAROTTO is a 40 y.o. male.  40 year old male presents with 3 days of right upper quadrant and epigastric abdominal discomfort.  He has had several episodes of emesis.  Denies any fever or chills.  No black or bloody stools.  Pain does go through to his back.  Denies any use of alcohol or acetaminophen.  Pain has been unresponsive to Zofran use at home.  Denies any prior history of same        Past Medical History:  Diagnosis Date  . Diabetes mellitus without complication [facility])     Patient Active Problem List   Diagnosis Date Noted  . Hyperglycemia 08/14/2013  . Major depressive disorder, recurrent episode, severe, without mention of psychotic behavior 08/13/2013  . Suicidal ideations 08/13/2013  . Severe recurrent major depression without psychotic features (HCC) 08/13/2013    No past surgical history on file.     No family history on file.  Social History   Tobacco Use  . Smoking status: Current Every Day Smoker    Packs/day: 0.25    Years: 15.00    Pack years: 3.75    Types: Cigarettes  Substance Use Topics  . Alcohol use: Yes    Comment: occ  . Drug use: No    Home Medications Prior to Admission medications   Medication Sig Start Date End Date Taking? Authorizing Provider  emtricitabine-tenofovir AF (DESCOVY) 200-25 MG tablet Take 1 tablet by mouth daily. 01/11/20   Kuppelweiser, Cassie L, RPH-CPP  emtricitabine-tenofovir AF (DESCOVY) 200-25 MG tablet TAKE 1 TABLET BY MOUTH DAILY. 01/06/20 01/05/21  Kuppelweiser, Cassie L, RPH-CPP  metFORMIN (GLUCOPHAGE) 500 MG tablet Take 1 tablet (500 mg total) by mouth 2 (two) times daily with a meal. 06/25/15   Alvira Monday, MD    Allergies    Patient has no known allergies.  Review of Systems   Review of Systems  All other  systems reviewed and are negative.   Physical Exam Updated Vital Signs BP (!) 170/99 (BP Location: Right Arm) Comment: Simultaneous filing. User may not have seen previous data.  Pulse 68 Comment: Simultaneous filing. User may not have seen previous data.  Temp 98.2 F (36.8 C) (Oral)   Resp 17 Comment: Simultaneous filing. User may not have seen previous data.  Ht 1.753 m (5\' 9" )   SpO2 100% Comment: Simultaneous filing. User may not have seen previous data.  BMI 26.73 kg/m   Physical Exam Vitals and nursing note reviewed.  Constitutional:      General: He is not in acute distress.    Appearance: Normal appearance. He is well-developed. He is not toxic-appearing.  HENT:     Head: Normocephalic and atraumatic.  Eyes:     General: Lids are normal.     Conjunctiva/sclera: Conjunctivae normal.     Pupils: Pupils are equal, round, and reactive to light.     Comments: Scleral icterus noted  Neck:     Thyroid: No thyroid mass.     Trachea: No tracheal deviation.  Cardiovascular:     Rate and Rhythm: Normal rate and regular rhythm.     Heart sounds: Normal heart sounds. No murmur heard. No gallop.   Pulmonary:     Effort: Pulmonary effort is normal. No respiratory distress.     Breath  sounds: Normal breath sounds. No stridor. No decreased breath sounds, wheezing, rhonchi or rales.  Abdominal:     General: Bowel sounds are normal. There is no distension.     Palpations: Abdomen is soft.     Tenderness: There is abdominal tenderness in the right upper quadrant and epigastric area. There is guarding. There is no rebound.    Musculoskeletal:        General: No tenderness. Normal range of motion.     Cervical back: Normal range of motion and neck supple.  Skin:    General: Skin is warm and dry.     Findings: No abrasion or rash.  Neurological:     Mental Status: He is alert and oriented to person, place, and time.     GCS: GCS eye subscore is 4. GCS verbal subscore is 5. GCS  motor subscore is 6.     Cranial Nerves: No cranial nerve deficit.     Sensory: No sensory deficit.  Psychiatric:        Speech: Speech normal.        Behavior: Behavior normal.     ED Results / Procedures / Treatments   Labs (all labs ordered are listed, but only abnormal results are displayed) Labs Reviewed  COMPREHENSIVE METABOLIC PANEL - Abnormal; Notable for the following components:      Result Value   Sodium 131 (*)    Chloride 87 (*)    Glucose, Bld 352 (*)    BUN 21 (*)    Total Protein 8.2 (*)    AST 134 (*)    ALT 201 (*)    Alkaline Phosphatase 583 (*)    Total Bilirubin 11.5 (*)    Anion gap 17 (*)    All other components within normal limits  LIPASE, BLOOD - Abnormal; Notable for the following components:   Lipase 527 (*)    All other components within normal limits  CBG MONITORING, ED - Abnormal; Notable for the following components:   Glucose-Capillary 383 (*)    All other components within normal limits  RESP PANEL BY RT-PCR (FLU A&B, COVID) ARPGX2  CBC WITH DIFFERENTIAL/PLATELET    EKG None  Radiology No results found.  Procedures Procedures   Medications Ordered in ED Medications  lactated ringers infusion (has no administration in time range)  lactated ringers bolus 1,000 mL (has no administration in time range)  morphine 4 MG/ML injection 6 mg (has no administration in time range)  ondansetron (ZOFRAN) injection 4 mg (has no administration in time range)    ED Course  I have reviewed the triage vital signs and the nursing notes.  Pertinent labs & imaging results that were available during my care of the patient were reviewed by me and considered in my medical decision making (see chart for details).    MDM Rules/Calculators/A&P                          Patient with evidence of likely gallstone pancreatitis based on his elevated lipase as well as elevated bilirubin.  No leukocytosis on his CBC.  He is tender in his right upper  quadrant.  Patient also with possible cholecystitis.  Right upper quadrant ultrasound ordered and results pending at this time.  He may require abdominal CT.  Signed out to next provider Final Clinical Impression(s) / ED Diagnoses Final diagnoses:  None    Rx / DC Orders ED Discharge Orders  None       Lorre Nick, MD 07/01/20 2233

## 2020-07-02 ENCOUNTER — Encounter (HOSPITAL_COMMUNITY): Payer: Self-pay

## 2020-07-02 ENCOUNTER — Inpatient Hospital Stay (HOSPITAL_COMMUNITY): Payer: Self-pay

## 2020-07-02 ENCOUNTER — Emergency Department (HOSPITAL_COMMUNITY): Payer: Self-pay

## 2020-07-02 DIAGNOSIS — K859 Acute pancreatitis without necrosis or infection, unspecified: Principal | ICD-10-CM

## 2020-07-02 DIAGNOSIS — R17 Unspecified jaundice: Secondary | ICD-10-CM

## 2020-07-02 DIAGNOSIS — E1165 Type 2 diabetes mellitus with hyperglycemia: Secondary | ICD-10-CM

## 2020-07-02 HISTORY — DX: Acute pancreatitis without necrosis or infection, unspecified: K85.90

## 2020-07-02 LAB — COMPREHENSIVE METABOLIC PANEL
ALT: 169 U/L — ABNORMAL HIGH (ref 0–44)
AST: 99 U/L — ABNORMAL HIGH (ref 15–41)
Albumin: 3.5 g/dL (ref 3.5–5.0)
Alkaline Phosphatase: 550 U/L — ABNORMAL HIGH (ref 38–126)
Anion gap: 12 (ref 5–15)
BUN: 22 mg/dL — ABNORMAL HIGH (ref 6–20)
CO2: 29 mmol/L (ref 22–32)
Calcium: 9.3 mg/dL (ref 8.9–10.3)
Chloride: 89 mmol/L — ABNORMAL LOW (ref 98–111)
Creatinine, Ser: 0.79 mg/dL (ref 0.61–1.24)
GFR, Estimated: >60 mL/min (ref >60)
Glucose, Bld: 323 mg/dL — ABNORMAL HIGH (ref 70–99)
Potassium: 4.2 mmol/L (ref 3.5–5.1)
Sodium: 130 mmol/L — ABNORMAL LOW (ref 135–145)
Total Bilirubin: 5.5 mg/dL — ABNORMAL HIGH (ref 0.3–1.2)
Total Protein: 7.9 g/dL (ref 6.5–8.1)

## 2020-07-02 LAB — GAMMA GT: GGT: 432 U/L — ABNORMAL HIGH (ref 7–50)

## 2020-07-02 LAB — BILIRUBIN, FRACTIONATED(TOT/DIR/INDIR)
Bilirubin, Direct: 3 mg/dL — ABNORMAL HIGH (ref 0.0–0.2)
Indirect Bilirubin: 2.4 mg/dL — ABNORMAL HIGH (ref 0.3–0.9)
Total Bilirubin: 5.4 mg/dL — ABNORMAL HIGH (ref 0.3–1.2)

## 2020-07-02 LAB — CBC
HCT: 45.4 % (ref 39.0–52.0)
Hemoglobin: 15.2 g/dL (ref 13.0–17.0)
MCH: 31.1 pg (ref 26.0–34.0)
MCHC: 33.5 g/dL (ref 30.0–36.0)
MCV: 93 fL (ref 80.0–100.0)
Platelets: 235 10*3/uL (ref 150–400)
RBC: 4.88 MIL/uL (ref 4.22–5.81)
RDW: 12.3 % (ref 11.5–15.5)
WBC: 8.7 10*3/uL (ref 4.0–10.5)
nRBC: 0 % (ref 0.0–0.2)

## 2020-07-02 LAB — HIV ANTIBODY (ROUTINE TESTING W REFLEX): HIV Screen 4th Generation wRfx: NONREACTIVE

## 2020-07-02 LAB — ETHANOL: Alcohol, Ethyl (B): <10 mg/dL (ref <10)

## 2020-07-02 LAB — ACETAMINOPHEN LEVEL: Acetaminophen (Tylenol), Serum: <10 ug/mL — ABNORMAL LOW (ref 10–30)

## 2020-07-02 LAB — HEPATITIS PANEL, ACUTE
HCV Ab: NONREACTIVE
Hep A IgM: NONREACTIVE
Hep B C IgM: NONREACTIVE
Hepatitis B Surface Ag: NONREACTIVE

## 2020-07-02 LAB — GLUCOSE, CAPILLARY
Glucose-Capillary: 352 mg/dL — ABNORMAL HIGH (ref 70–99)
Glucose-Capillary: 228 mg/dL — ABNORMAL HIGH (ref 70–99)
Glucose-Capillary: 210 mg/dL — ABNORMAL HIGH (ref 70–99)
Glucose-Capillary: 265 mg/dL — ABNORMAL HIGH (ref 70–99)
Glucose-Capillary: 180 mg/dL — ABNORMAL HIGH (ref 70–99)

## 2020-07-02 LAB — PROTIME-INR
INR: 0.9 (ref 0.8–1.2)
Prothrombin Time: 12 seconds (ref 11.4–15.2)

## 2020-07-02 IMAGING — MR MR ABDOMEN WO/W CM MRCP
19 of 22 series · 42 of 48 positions shown · IV contrast (gadavist)
Comparison: CT abdomen pelvis, [DATE]

CLINICAL DATA: Jaundice, acute pancreatitis, nausea, vomiting

EXAM:
MRI ABDOMEN WITHOUT AND WITH CONTRAST (INCLUDING MRCP)
TECHNIQUE: Multiplanar multisequence MR imaging of the abdomen was performed
both before and after the administration of intravenous contrast.
Heavily T2-weighted images of the biliary and pancreatic ducts were
obtained, and three-dimensional MRCP images were rendered by post
processing.
CONTRAST:  7mL GADAVIST GADOBUTROL 1 MMOL/ML IV SOLN

[Series 2: DWI · axial · 6.0mm · 1.49mm/px · 1 of 72 slices shown (1 of 2)]
[im 1/72]
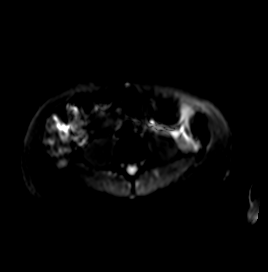

[Series 3: DWI · axial · 6.0mm · 1.49mm/px · 1 of 36 slices shown (2 of 2)]
[im 1/36]
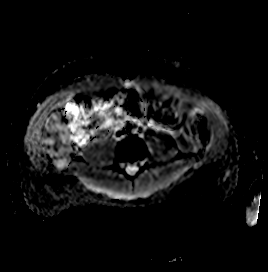

[Series 4: T2 fat-sat · axial · 6.0mm · 1.25mm/px · 1 of 36 slices shown]
[im 1/36]
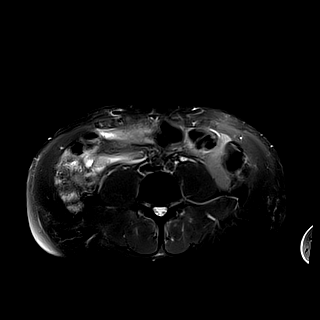

[Series 7: cor_3d_spc_trig · coronal · 1.0mm · 0.49mm/px · 3 of 80 slices shown]
[im 1/80]
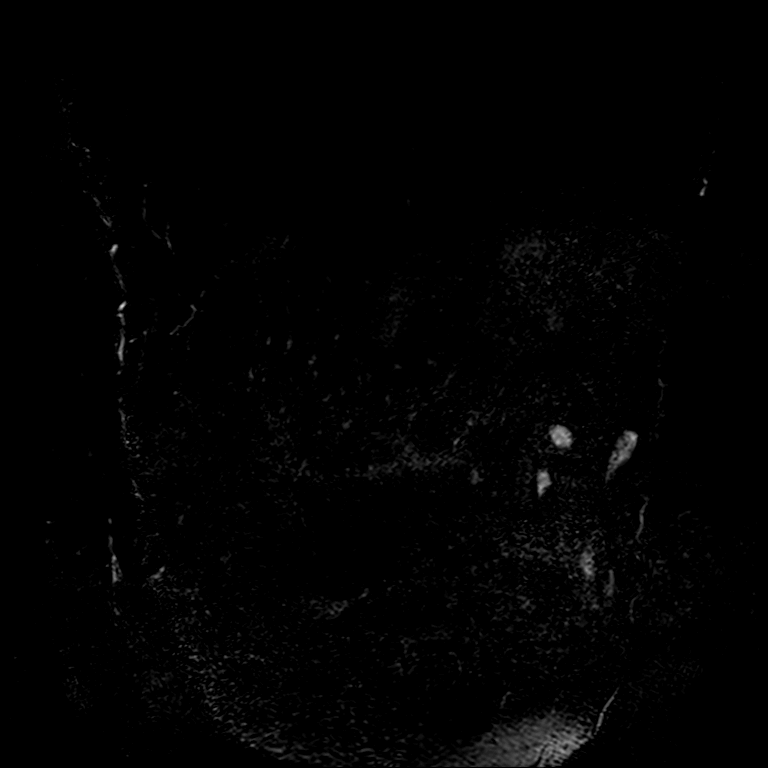
[im 40/80]
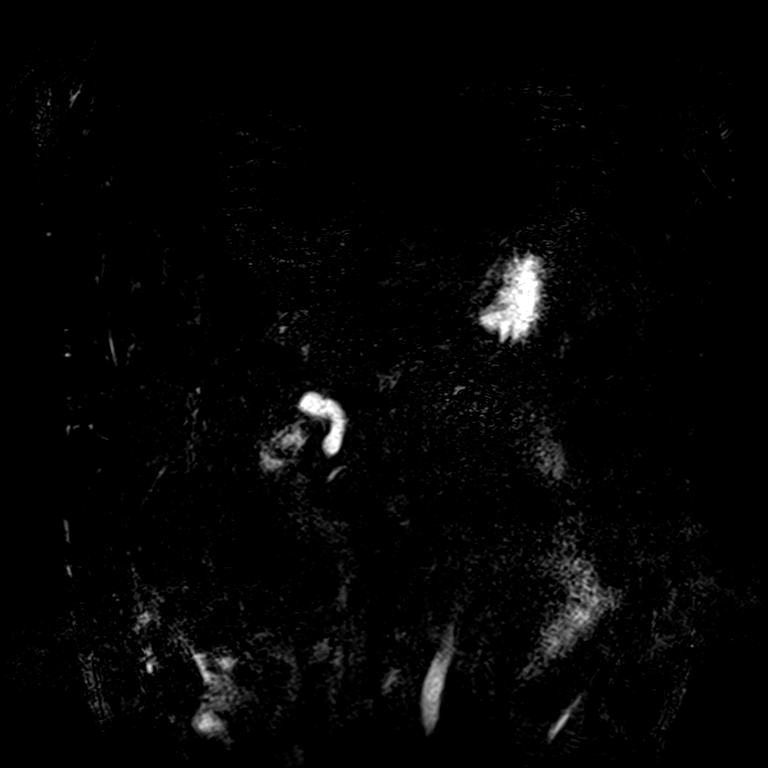
[im 80/80]
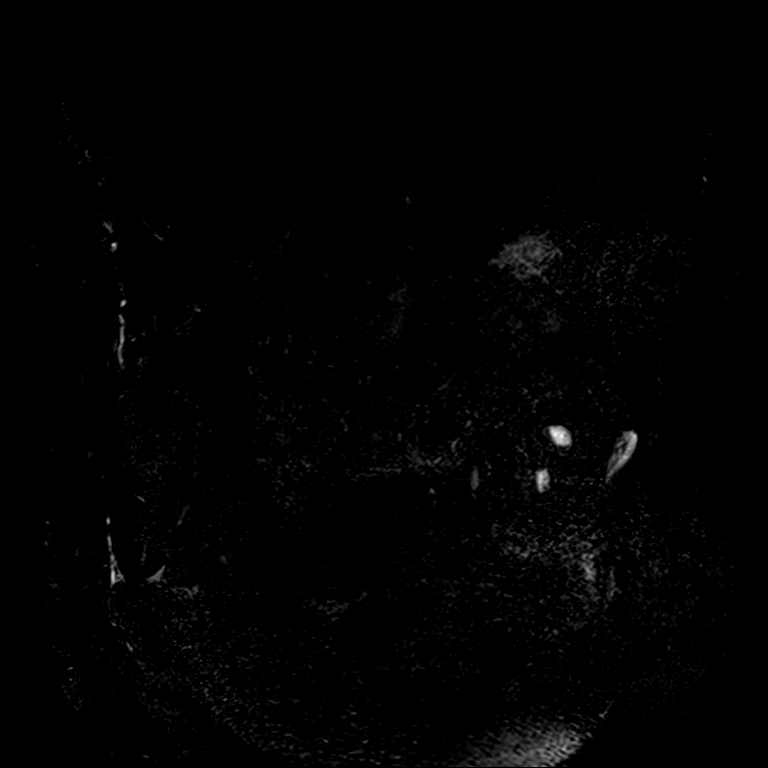

[Series 11: T2 · coronal · 6.0mm · 1.48mm/px · 1 of 30 slices shown (1 of 2)]
[im 1/30]
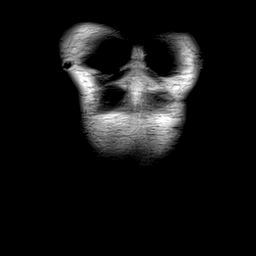

[Series 12: T1 · axial · 3.0mm · 1.25mm/px · z∈[-171,+90]mm · 3 of 88 slices shown (1 of 2)]
[im 1/88]
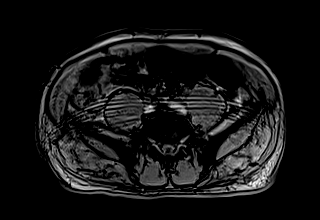
[im 44/88]
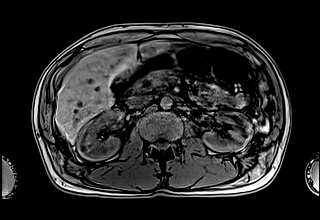
[im 88/88]
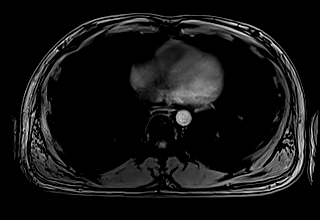

[Series 13: T1 · axial · 3.0mm · 1.25mm/px · z∈[-171,+90]mm · 3 of 88 slices shown (2 of 2)]
[im 1/88]
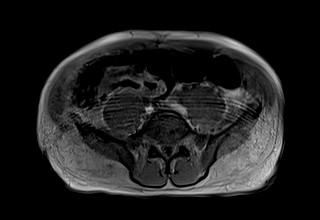
[im 44/88]
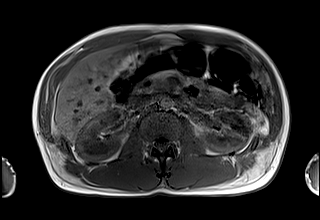
[im 88/88]
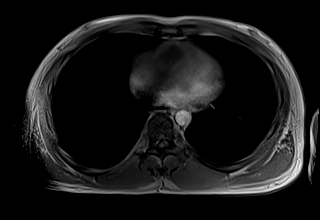

[Series 14: cor obl thk · sagittal · 50.0mm · 0.78mm/px · 1 of 9 slices shown]
[im 1/9]
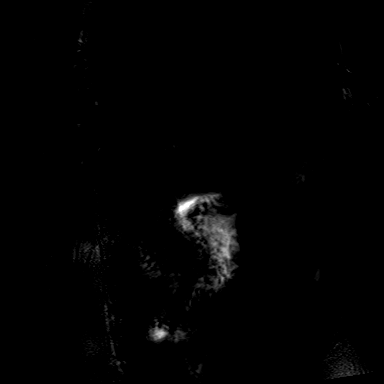

[Series 15: T2 · axial · 6.0mm · 1.56mm/px · 1 of 37 slices shown (2 of 2)]
[im 1/37]
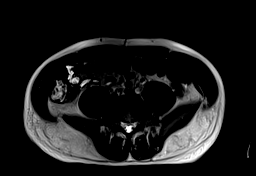

[Series 17: T1 dynamic · axial · 3.0mm · 1.25mm/px · z∈[-146,+115]mm · 3 of 88 slices shown (1 of 10)]
[im 1/88]
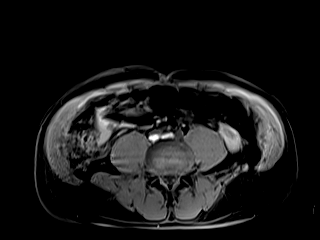
[im 44/88]
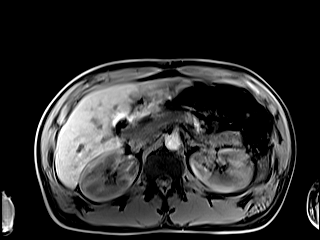
[im 88/88]
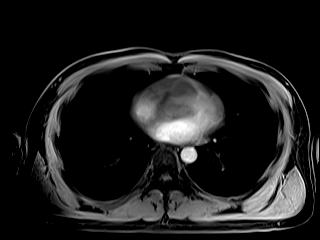

[Series 21: T1 dynamic · axial · 3.0mm · 1.25mm/px · z∈[-146,+115]mm · 3 of 88 slices shown (2 of 10)]
[im 1/88]
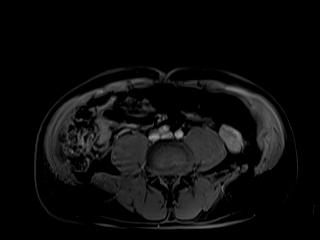
[im 44/88]
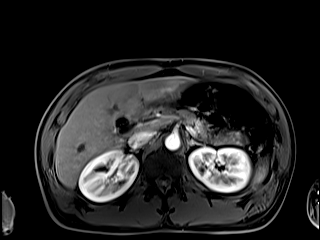
[im 88/88]
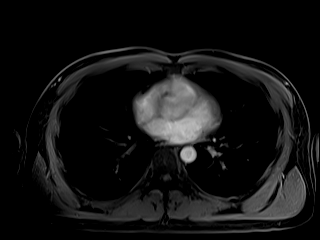

[Series 22: T1 dynamic · axial · 3.0mm · 1.25mm/px · z∈[-146,+115]mm · 3 of 88 slices shown (3 of 10)]
[im 1/88]
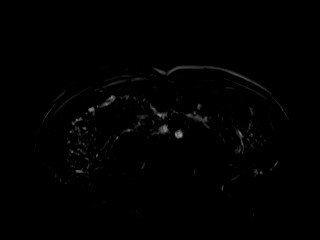
[im 44/88]
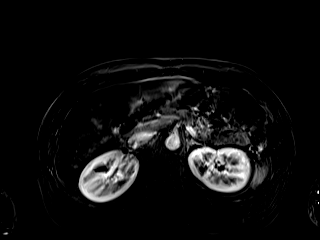
[im 88/88]
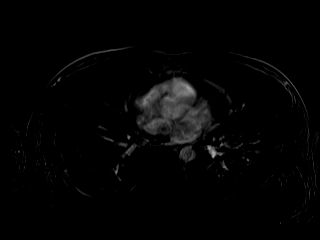

[Series 25: T1 dynamic · axial · 3.0mm · 1.25mm/px · z∈[-146,+115]mm · 3 of 88 slices shown (4 of 10)]
[im 1/88]
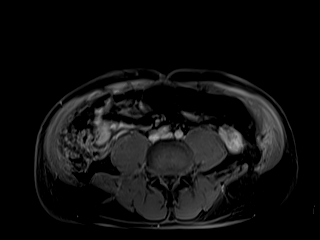
[im 44/88]
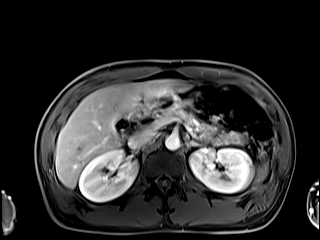
[im 88/88]
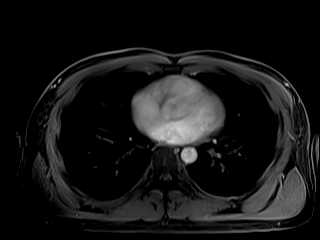

[Series 26: T1 dynamic · axial · 3.0mm · 1.25mm/px · z∈[-146,+115]mm · 3 of 88 slices shown (5 of 10)]
[im 1/88]
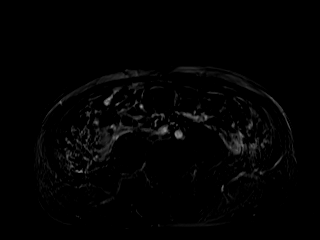
[im 44/88]
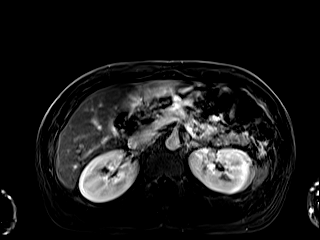
[im 88/88]
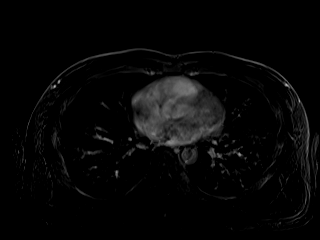

[Series 29: T1 dynamic · axial · 3.0mm · 1.25mm/px · z∈[-146,+115]mm · 3 of 88 slices shown (6 of 10)]
[im 1/88]
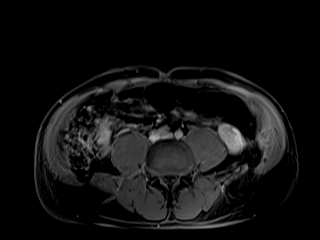
[im 44/88]
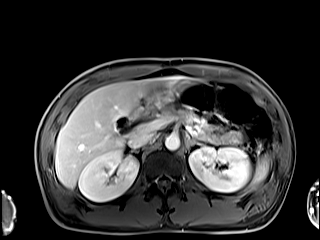
[im 88/88]
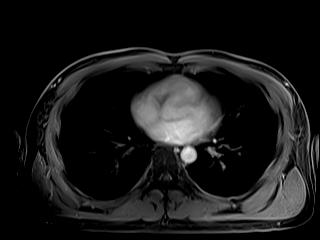

[Series 30: T1 dynamic · axial · 3.0mm · 1.25mm/px · z∈[-146,+115]mm · 3 of 88 slices shown (7 of 10)]
[im 1/88]
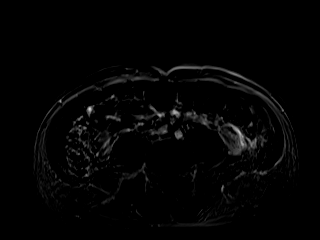
[im 44/88]
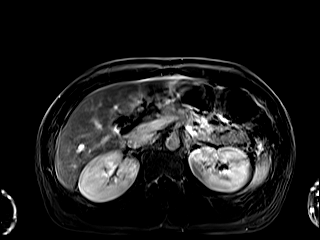
[im 88/88]
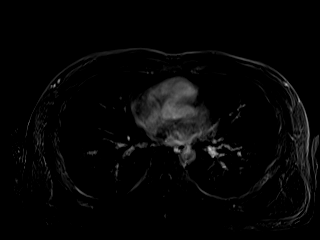

[Series 32: T1 dynamic · coronal · 4.0mm · 1.41mm/px · 2 of 56 slices shown (8 of 10)]
[im 1/56]
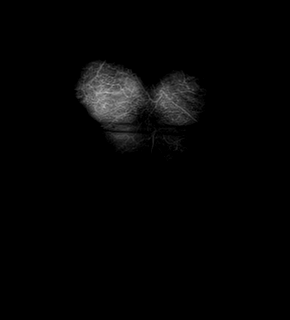
[im 56/56]
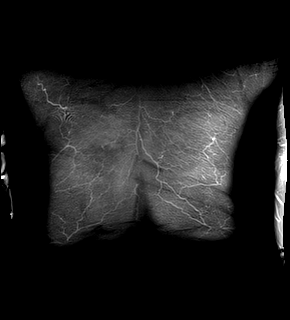

[Series 35: T1 dynamic · axial · 3.0mm · 1.25mm/px · z∈[-146,+115]mm · 3 of 88 slices shown (9 of 10)]
[im 1/88]
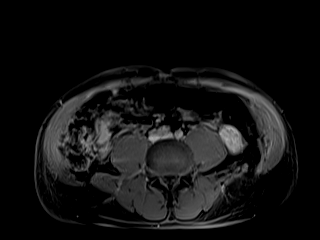
[im 44/88]
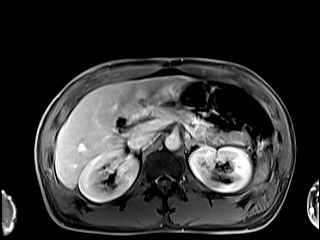
[im 88/88]
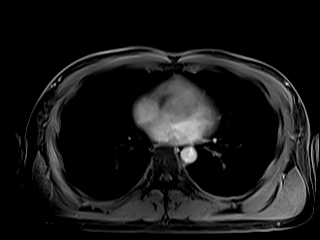

[Series 36: T1 dynamic · axial · 3.0mm · 1.25mm/px · 1 of 88 slices shown (10 of 10)]
[im 1/88]
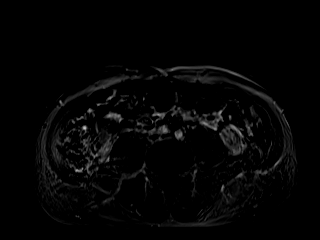

[42 of 48 positions shown; findings below may reference images not displayed]

FINDINGS: Lower chest: No acute findings.

Hepatobiliary: No mass or other parenchymal abnormality identified.
Status post cholecystectomy. No biliary ductal dilatation.

Pancreas: No mass, inflammatory changes, or other parenchymal
abnormality identified.

Spleen:  Within normal limits in size and appearance.

Adrenals/Urinary Tract: No masses identified. No evidence of
hydronephrosis.

Stomach/Bowel: Visualized portions within the abdomen are
unremarkable.

Vascular/Lymphatic: No pathologically enlarged lymph nodes
identified. No abdominal aortic aneurysm demonstrated.

Other:  None.

Musculoskeletal: No suspicious bone lesions identified.
IMPRESSION: 1. Status post cholecystectomy. No biliary ductal dilatation.
2. No MR evidence of pancreatitis or other acute findings in the
abdomen.

## 2020-07-02 IMAGING — CT CT ABD-PELV W/ CM
2 of 5 series · 16 of 46 positions shown, 18 images · IV contrast (omnipaque)
Comparison: None.

CLINICAL DATA: Acute pancreatitis, vomiting, unspecified abdominal
pain

EXAM:
CT ABDOMEN AND PELVIS WITH CONTRAST
TECHNIQUE: Multidetector CT imaging of the abdomen and pelvis was performed
using the standard protocol following bolus administration of
intravenous contrast.
CONTRAST:  100mL OMNIPAQUE IOHEXOL 300 MG/ML  SOLN

[Series 2: axial st · axial · 0.76mm/px · z∈[-501,-111]mm · 13 of 92 slices shown, 15 images]
[im 7/92  soft-tissue]
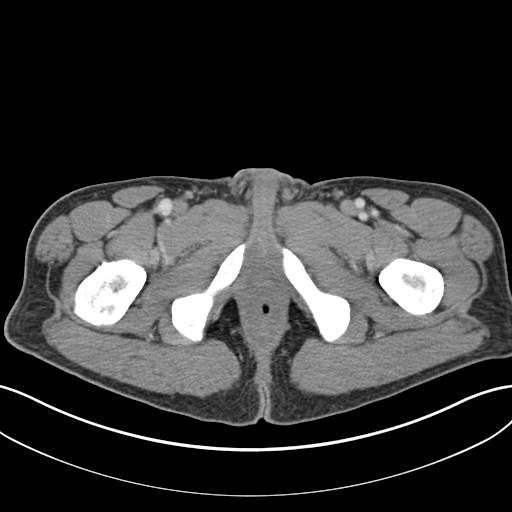
[im 7/92  bone]
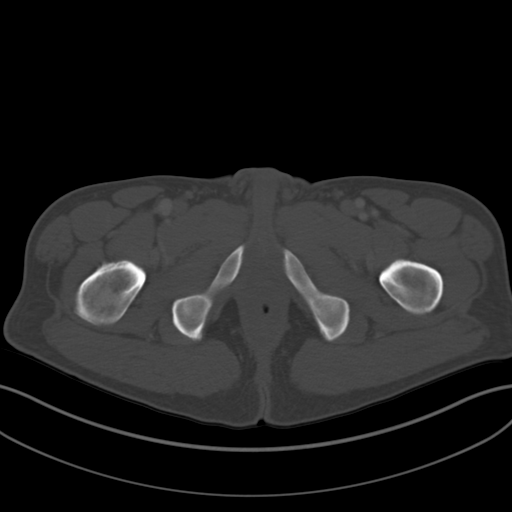
[im 14/92  soft-tissue]
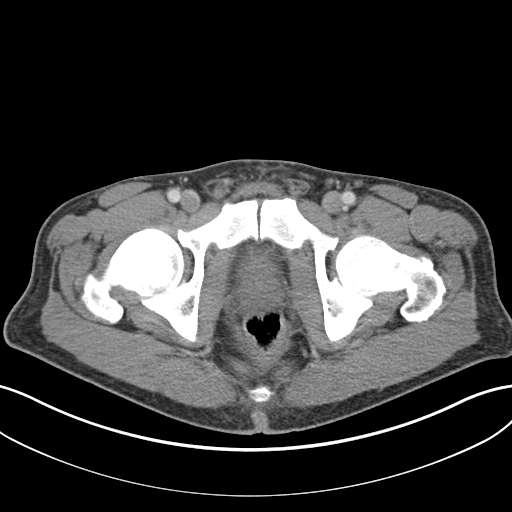
[im 20/92  soft-tissue]
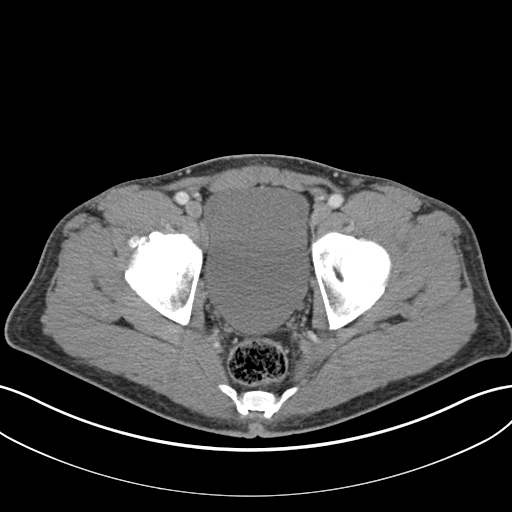
[im 27/92  soft-tissue]
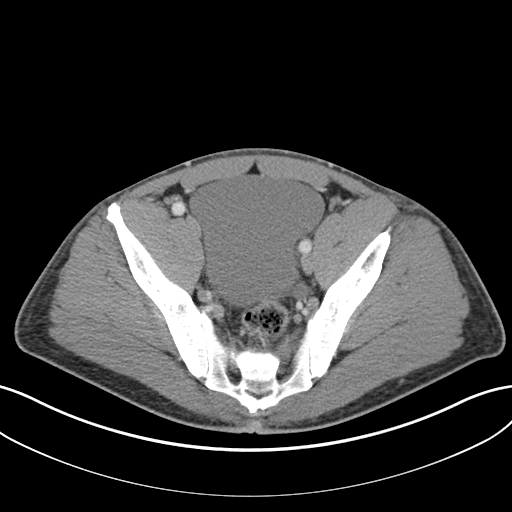
[im 33/92  soft-tissue]
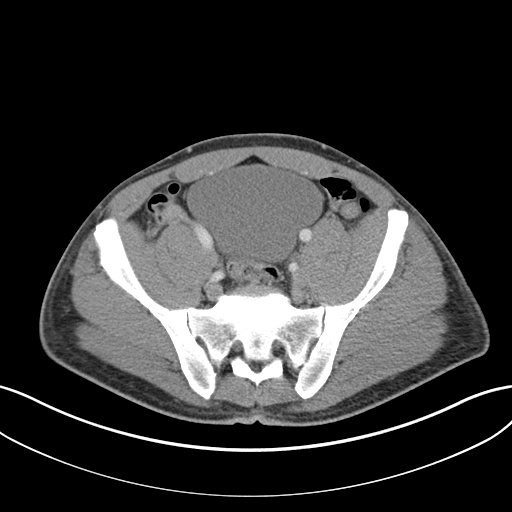
[im 40/92  soft-tissue]
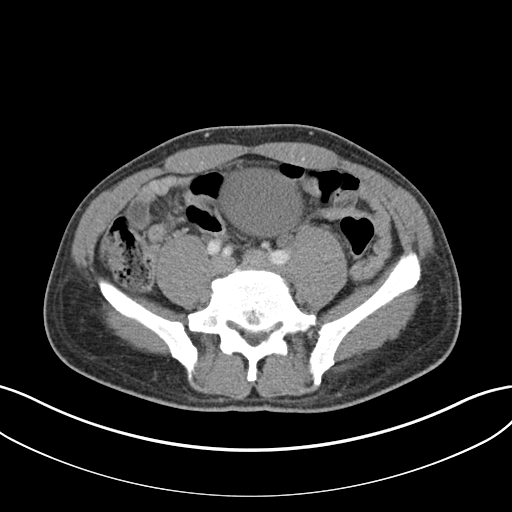
[im 46/92  soft-tissue]
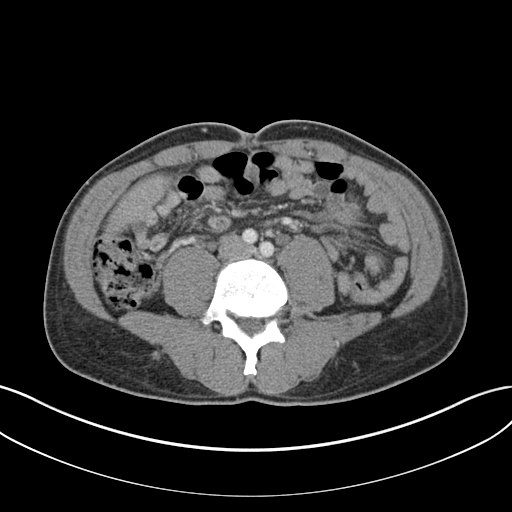
[im 53/92  soft-tissue]
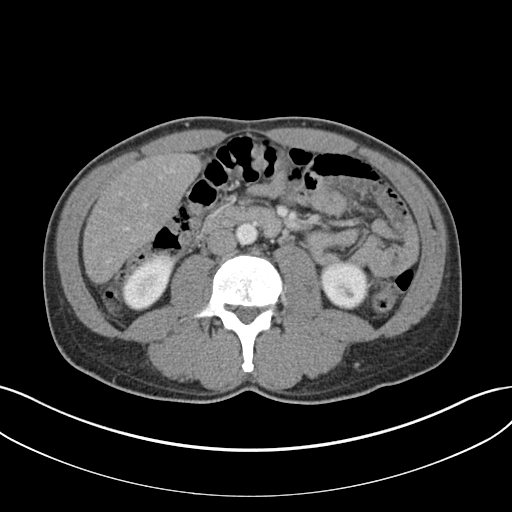
[im 59/92  soft-tissue]
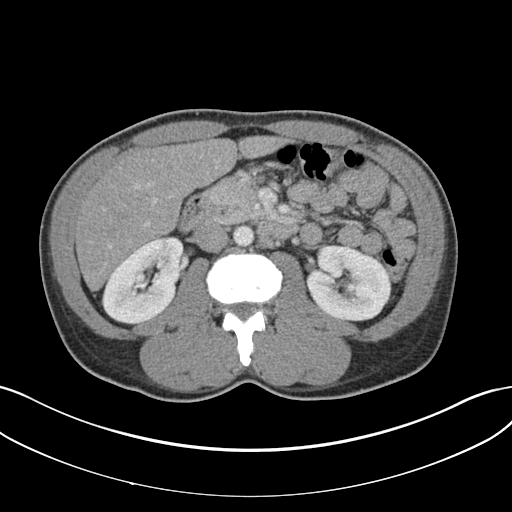
[im 59/92  bone]
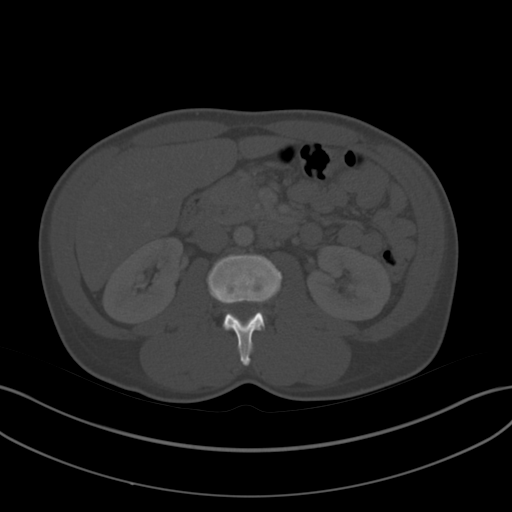
[im 66/92  soft-tissue]
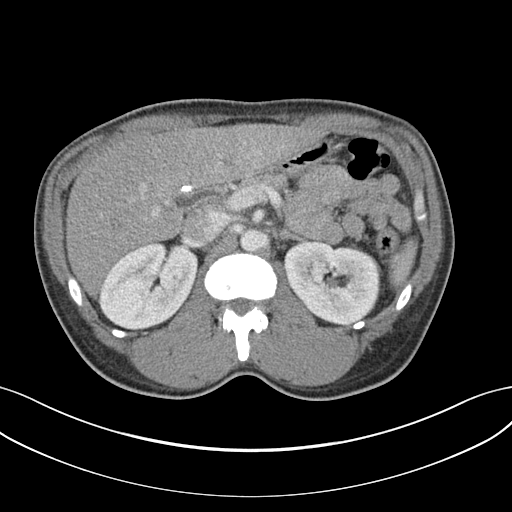
[im 72/92  soft-tissue]
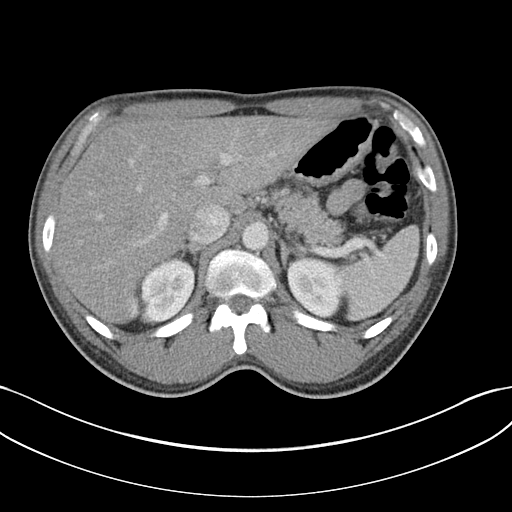
[im 79/92  soft-tissue]
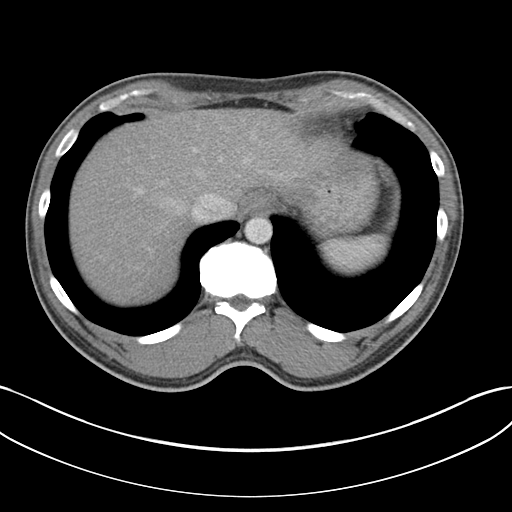
[im 85/92  soft-tissue]
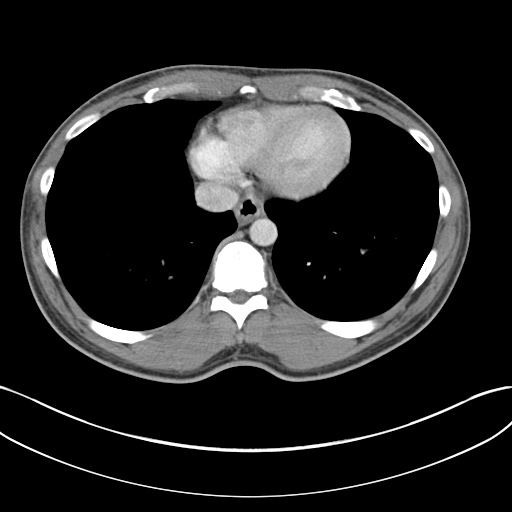

[Series 5: coronal st · coronal · 0.78mm/px · 3 of 151 slices shown]
[im 51/151  soft-tissue]
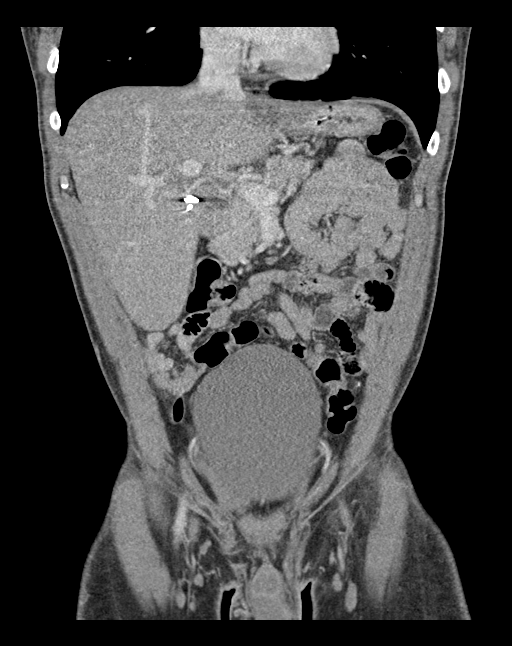
[im 67/151  soft-tissue]
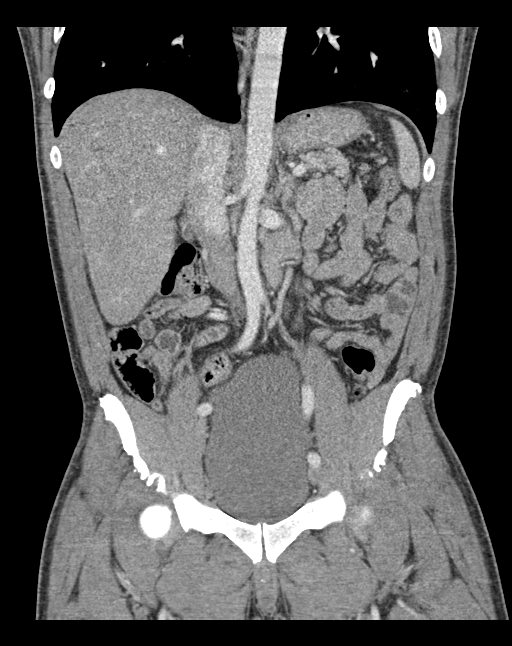
[im 84/151  soft-tissue]
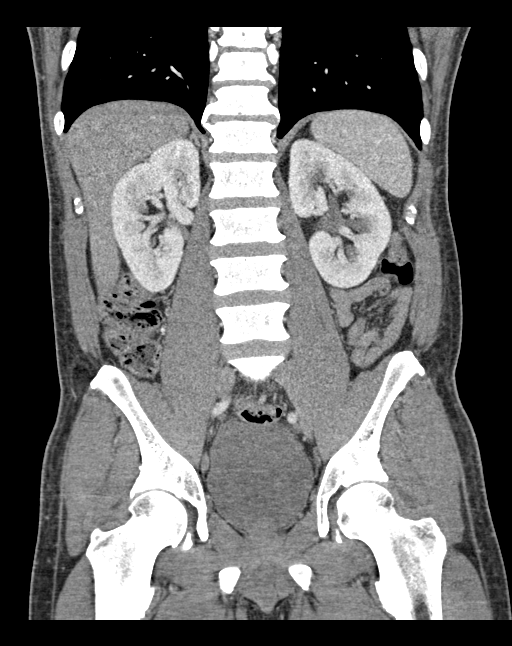

[16 of 46 positions shown; findings below may reference images not displayed]

FINDINGS: Lower chest: No acute abnormality.

Hepatobiliary: Mild hepatic steatosis. No focal intrahepatic
enhancing mass identified. No intra or extrahepatic biliary ductal
dilation. Cholecystectomy has been performed.

Pancreas: Unremarkable. No pancreatic ductal dilatation or
surrounding inflammatory changes.

Spleen: Unremarkable

Adrenals/Urinary Tract: Adrenal glands are unremarkable. Kidneys are
normal, without renal calculi, focal lesion, or hydronephrosis.
Bladder is mildly distended but is otherwise unremarkable.

Stomach/Bowel: Stomach is within normal limits. Appendix appears
normal. No evidence of bowel wall thickening, distention, or
inflammatory changes. No free intraperitoneal gas or fluid.

Vascular/Lymphatic: No significant vascular findings are present. No
enlarged abdominal or pelvic lymph nodes.

Reproductive: Prostate is unremarkable.

Other: No abdominal wall hernia identified. The rectum is
unremarkable.

Musculoskeletal: No acute bone abnormality. No lytic or blastic bone
lesions. Osseous structures are age-appropriate.
IMPRESSION: No acute intra-abdominal pathology identified. Specifically, normal
examination of the pancreas. No definite radiographic explanation
for the patient's reported symptoms.

Mild hepatic steatosis

## 2020-07-02 IMAGING — MR MR 3D RECON AT SCANNER
19 of 22 series · 42 of 48 positions shown · IV contrast (gadavist)
Comparison: CT abdomen pelvis, [DATE]

CLINICAL DATA: Jaundice, acute pancreatitis, nausea, vomiting

EXAM:
MRI ABDOMEN WITHOUT AND WITH CONTRAST (INCLUDING MRCP)
TECHNIQUE: Multiplanar multisequence MR imaging of the abdomen was performed
both before and after the administration of intravenous contrast.
Heavily T2-weighted images of the biliary and pancreatic ducts were
obtained, and three-dimensional MRCP images were rendered by post
processing.
CONTRAST:  7mL GADAVIST GADOBUTROL 1 MMOL/ML IV SOLN

[Series 2: DWI · axial · 6.0mm · 1.49mm/px · 1 of 72 slices shown (1 of 2)]
[im 1/72]
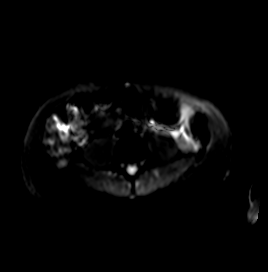

[Series 3: DWI · axial · 6.0mm · 1.49mm/px · 1 of 36 slices shown (2 of 2)]
[im 1/36]
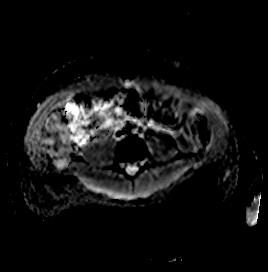

[Series 4: T2 fat-sat · axial · 6.0mm · 1.25mm/px · 1 of 36 slices shown]
[im 1/36]
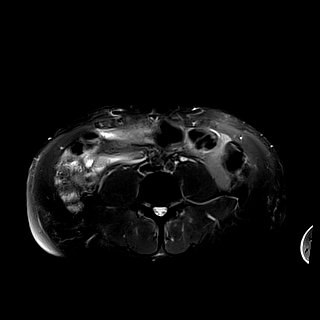

[Series 7: cor_3d_spc_trig · coronal · 1.0mm · 0.49mm/px · 3 of 80 slices shown]
[im 1/80]
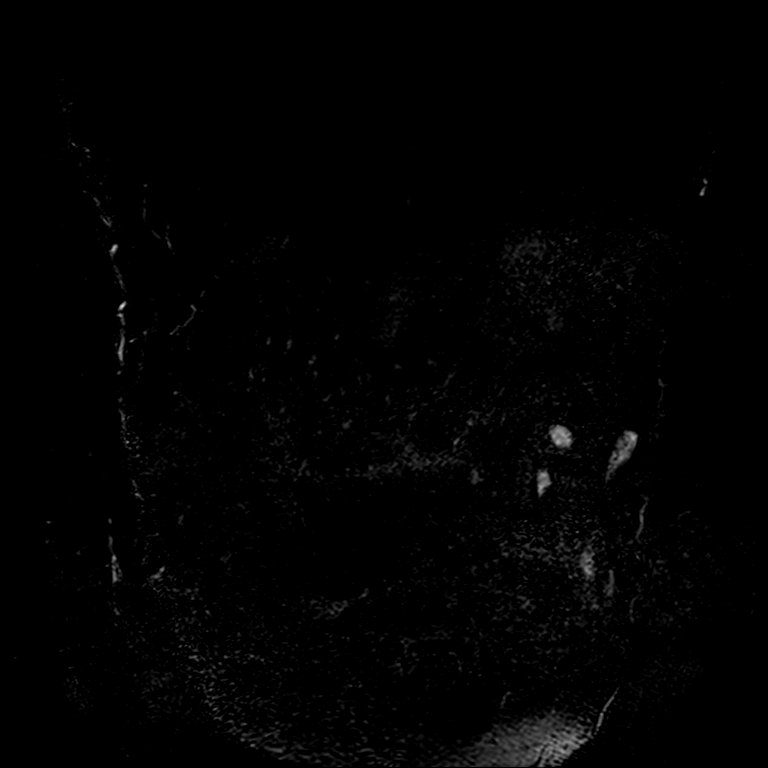
[im 40/80]
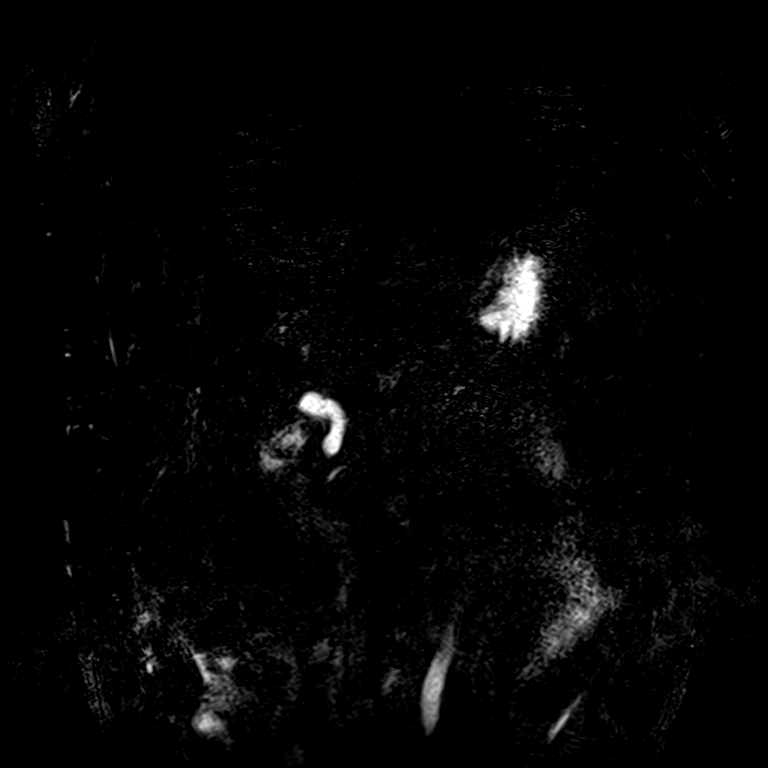
[im 80/80]
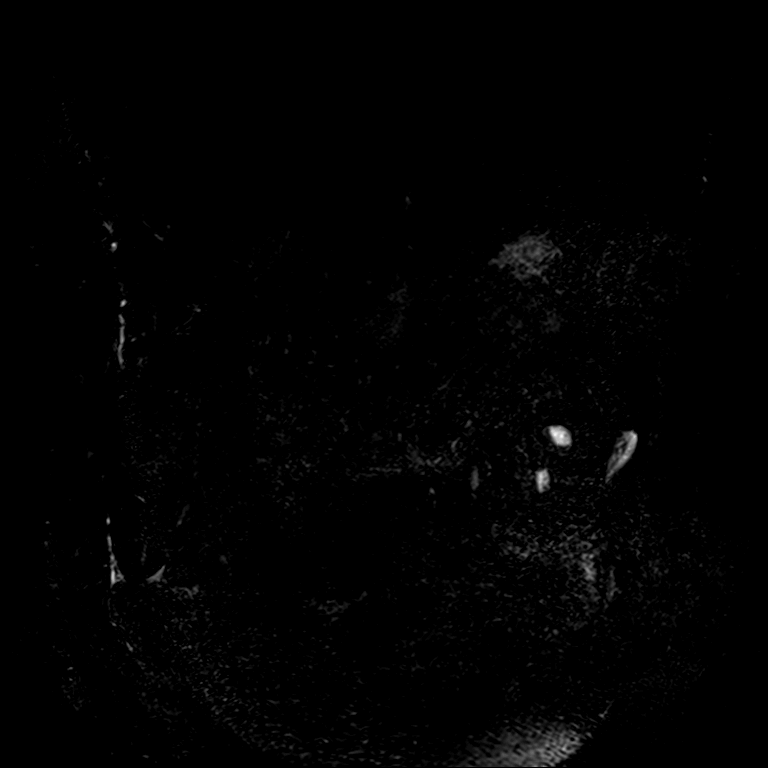

[Series 11: T2 · coronal · 6.0mm · 1.48mm/px · 1 of 30 slices shown (1 of 2)]
[im 1/30]
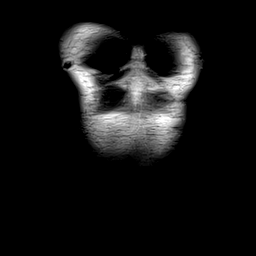

[Series 12: T1 · axial · 3.0mm · 1.25mm/px · z∈[-171,+90]mm · 3 of 88 slices shown (1 of 2)]
[im 1/88]
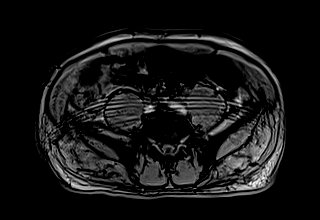
[im 44/88]
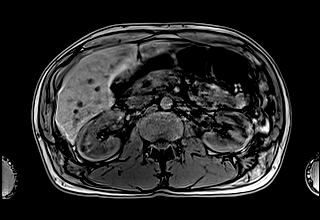
[im 88/88]
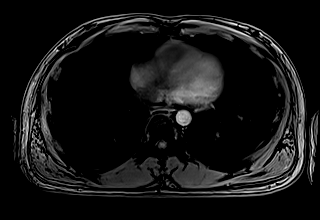

[Series 13: T1 · axial · 3.0mm · 1.25mm/px · z∈[-171,+90]mm · 3 of 88 slices shown (2 of 2)]
[im 1/88]
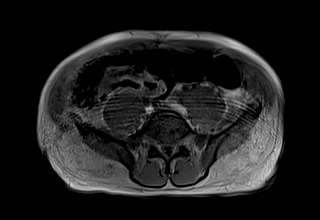
[im 44/88]
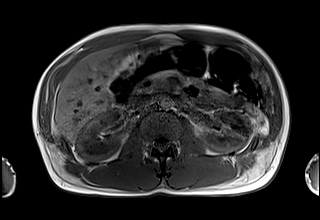
[im 88/88]
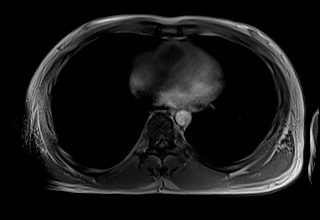

[Series 14: cor obl thk · sagittal · 50.0mm · 0.78mm/px · 1 of 9 slices shown]
[im 1/9]
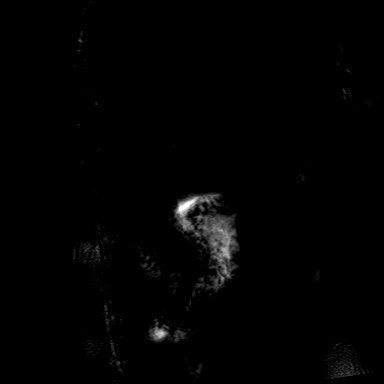

[Series 15: T2 · axial · 6.0mm · 1.56mm/px · 1 of 37 slices shown (2 of 2)]
[im 1/37]
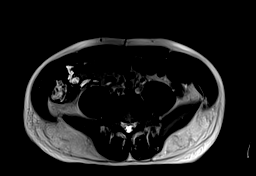

[Series 17: T1 dynamic · axial · 3.0mm · 1.25mm/px · z∈[-146,+115]mm · 3 of 88 slices shown (1 of 10)]
[im 1/88]
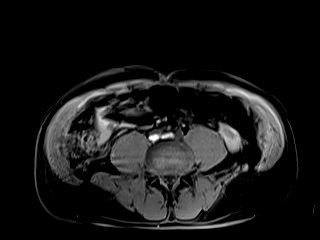
[im 44/88]
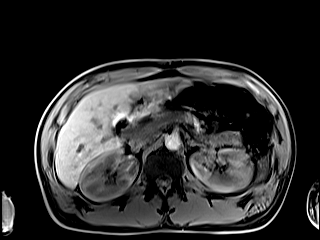
[im 88/88]
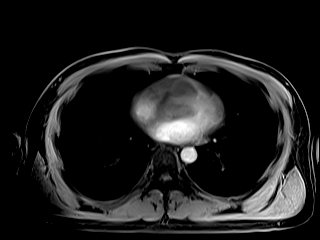

[Series 21: T1 dynamic · axial · 3.0mm · 1.25mm/px · z∈[-146,+115]mm · 3 of 88 slices shown (2 of 10)]
[im 1/88]
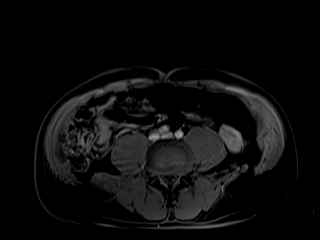
[im 44/88]
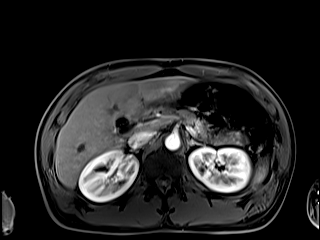
[im 88/88]
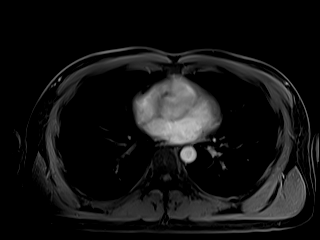

[Series 22: T1 dynamic · axial · 3.0mm · 1.25mm/px · z∈[-146,+115]mm · 3 of 88 slices shown (3 of 10)]
[im 1/88]
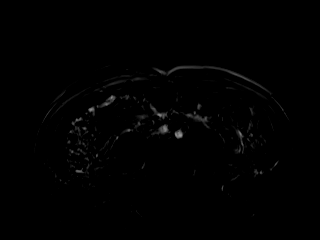
[im 44/88]
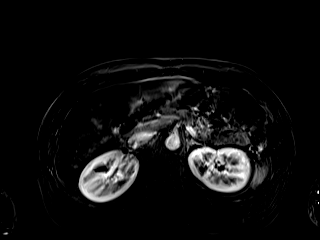
[im 88/88]
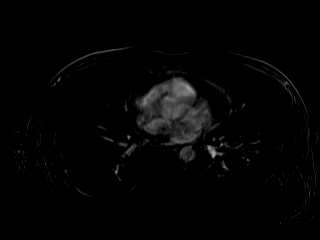

[Series 25: T1 dynamic · axial · 3.0mm · 1.25mm/px · z∈[-146,+115]mm · 3 of 88 slices shown (4 of 10)]
[im 1/88]
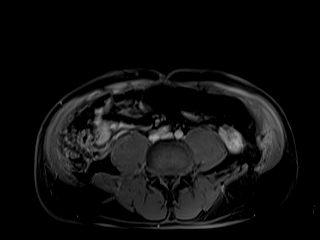
[im 44/88]
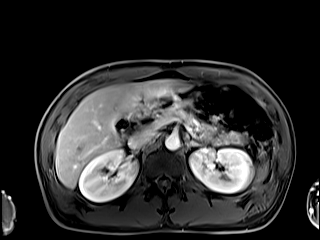
[im 88/88]
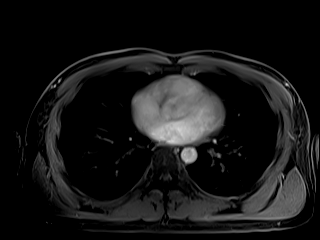

[Series 26: T1 dynamic · axial · 3.0mm · 1.25mm/px · z∈[-146,+115]mm · 3 of 88 slices shown (5 of 10)]
[im 1/88]
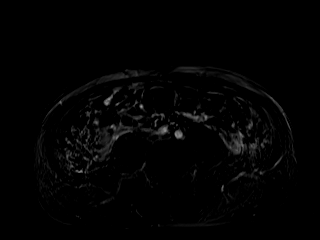
[im 44/88]
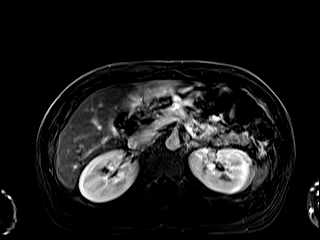
[im 88/88]
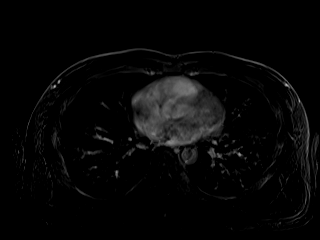

[Series 29: T1 dynamic · axial · 3.0mm · 1.25mm/px · z∈[-146,+115]mm · 3 of 88 slices shown (6 of 10)]
[im 1/88]
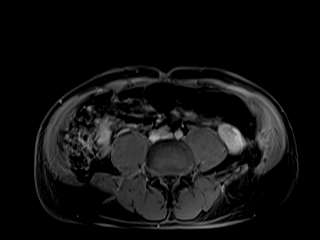
[im 44/88]
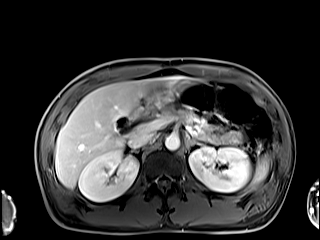
[im 88/88]
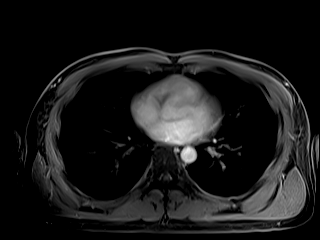

[Series 30: T1 dynamic · axial · 3.0mm · 1.25mm/px · z∈[-146,+115]mm · 3 of 88 slices shown (7 of 10)]
[im 1/88]
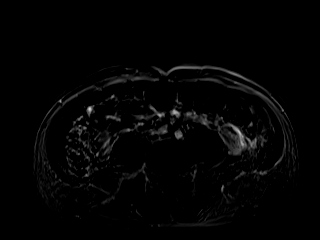
[im 44/88]
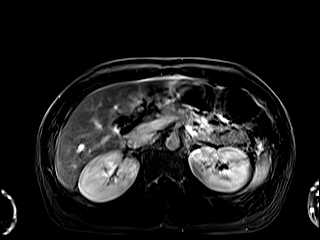
[im 88/88]
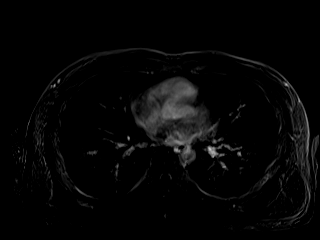

[Series 32: T1 dynamic · coronal · 4.0mm · 1.41mm/px · 2 of 56 slices shown (8 of 10)]
[im 1/56]
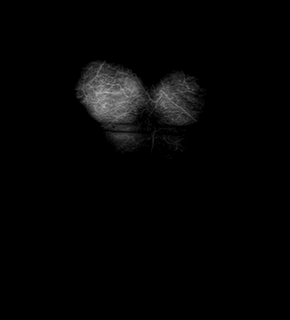
[im 56/56]
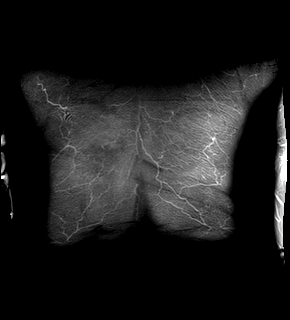

[Series 35: T1 dynamic · axial · 3.0mm · 1.25mm/px · z∈[-146,+115]mm · 3 of 88 slices shown (9 of 10)]
[im 1/88]
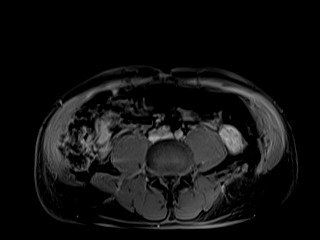
[im 44/88]
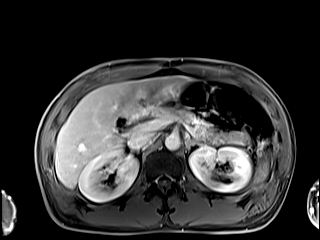
[im 88/88]
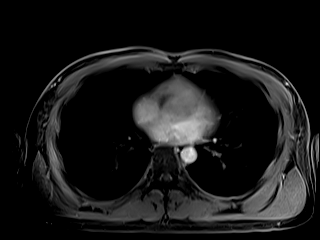

[Series 36: T1 dynamic · axial · 3.0mm · 1.25mm/px · 1 of 88 slices shown (10 of 10)]
[im 1/88]
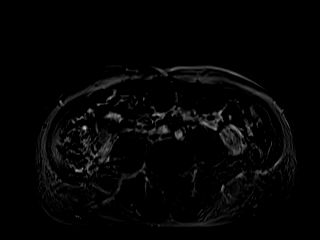

[42 of 48 positions shown; findings below may reference images not displayed]

FINDINGS: Lower chest: No acute findings.

Hepatobiliary: No mass or other parenchymal abnormality identified.
Status post cholecystectomy. No biliary ductal dilatation.

Pancreas: No mass, inflammatory changes, or other parenchymal
abnormality identified.

Spleen:  Within normal limits in size and appearance.

Adrenals/Urinary Tract: No masses identified. No evidence of
hydronephrosis.

Stomach/Bowel: Visualized portions within the abdomen are
unremarkable.

Vascular/Lymphatic: No pathologically enlarged lymph nodes
identified. No abdominal aortic aneurysm demonstrated.

Other:  None.

Musculoskeletal: No suspicious bone lesions identified.
IMPRESSION: 1. Status post cholecystectomy. No biliary ductal dilatation.
2. No MR evidence of pancreatitis or other acute findings in the
abdomen.

## 2020-07-02 MED ORDER — SODIUM CHLORIDE 0.9 % IV SOLN: INTRAVENOUS | Status: AC

## 2020-07-02 MED ORDER — HYDRALAZINE HCL 25 MG PO TABS: 25.0000 mg | ORAL_TABLET | Freq: Three times a day (TID) | ORAL | Status: DC | PRN

## 2020-07-02 MED ORDER — ONDANSETRON HCL 4 MG PO TABS: 4.0000 mg | ORAL_TABLET | Freq: Four times a day (QID) | ORAL | Status: DC | PRN

## 2020-07-02 MED ORDER — ONDANSETRON HCL 4 MG/2ML IJ SOLN: 4.0000 mg | Freq: Four times a day (QID) | INTRAMUSCULAR | Status: DC | PRN

## 2020-07-02 MED ORDER — FENTANYL CITRATE (PF) 100 MCG/2ML IJ SOLN
25.0000 ug | INTRAMUSCULAR | Status: DC | PRN
  Administered 2020-07-02 (×2): 25 ug via INTRAVENOUS
  Filled 2020-07-02 (×2): qty 2

## 2020-07-02 MED ORDER — INSULIN ASPART 100 UNIT/ML IJ SOLN
0.0000 [IU] | INTRAMUSCULAR | Status: DC
  Administered 2020-07-02: 5 [IU] via SUBCUTANEOUS
  Administered 2020-07-02: 2 [IU] via SUBCUTANEOUS
  Administered 2020-07-02: 9 [IU] via SUBCUTANEOUS
  Administered 2020-07-02 (×3): 3 [IU] via SUBCUTANEOUS
  Administered 2020-07-03: 2 [IU] via SUBCUTANEOUS
  Administered 2020-07-03: 1 [IU] via SUBCUTANEOUS
  Administered 2020-07-03: 2 [IU] via SUBCUTANEOUS
  Filled 2020-07-02: qty 0.09

## 2020-07-02 MED ORDER — SODIUM CHLORIDE (PF) 0.9 % IJ SOLN
INTRAMUSCULAR | Status: AC
  Filled 2020-07-02: qty 50

## 2020-07-02 MED ORDER — IOHEXOL 300 MG/ML  SOLN
100.0000 mL | Freq: Once | INTRAMUSCULAR | Status: AC | PRN
  Administered 2020-07-02: 100 mL via INTRAVENOUS

## 2020-07-02 MED ORDER — GADOBUTROL 1 MMOL/ML IV SOLN
7.0000 mL | Freq: Once | INTRAVENOUS | Status: AC | PRN
  Administered 2020-07-02: 7 mL via INTRAVENOUS

## 2020-07-02 MED ORDER — MORPHINE SULFATE (PF) 4 MG/ML IV SOLN
4.0000 mg | Freq: Once | INTRAVENOUS | Status: AC
  Administered 2020-07-02: 4 mg via INTRAVENOUS
  Filled 2020-07-02: qty 1

## 2020-07-02 MED ORDER — ENOXAPARIN SODIUM 40 MG/0.4ML IJ SOSY
40.0000 mg | PREFILLED_SYRINGE | INTRAMUSCULAR | Status: DC
  Administered 2020-07-02 – 2020-07-03 (×2): 40 mg via SUBCUTANEOUS
  Filled 2020-07-02 (×2): qty 0.4

## 2020-07-02 NOTE — H&P (Signed)
History and Physical    Lawrence Mccormick FVC:[id number] DOB: 01/31/1980 DOA: 07/01/2020  PCP: Pcp, No (Inactive)   Patient coming from: Home   Chief Complaint: Abdominal pain, N/V  HPI: Lawrence Mccormick is a 40 y.o. male with medical history significant for type 2 diabetes mellitus and at high-risk for contract HIV who had been on PrEP and now presents to emergency department for evaluation of abdominal pain, nausea, and vomiting.  Patient reports 3 to 4 days of upper abdominal pain, nausea, and nonbloody vomiting.  He denies any associated fevers, chills, or diarrhea.  Pain had initially been in the right upper quadrant but has since shifted towards the left upper quadrant.  He has never experienced this previously.  He denies any recent alcohol use.  Denies taking acetaminophen or any other over-the-counter medications.  He has not been taking metformin recently.  ED Course: Upon arrival to the ED, patient is found to be afebrile, saturating well on room air, and with blood pressure 170/100.  Chemistries were notable for glucose 252, lipase 527, alkaline phosphatase 583, AST 134, ALT 201, and total bilirubin 11.5.  CBC is unremarkable.  COVID PCR is negative.  Right upper quadrant ultrasound was a normal study and CT of the abdomen and pelvis is negative for acute findings.  Patient was given a liter of LR, morphine, and Zofran in the ED.  Review of Systems:  All other systems reviewed and apart from HPI, are negative.  Past Medical History:  Diagnosis Date  . Diabetes mellitus without complication (HCC)     History reviewed. No pertinent surgical history.  Social History:   reports that he has been smoking cigarettes. He has a 3.75 pack-year smoking history. He does not have any smokeless tobacco history on file. He reports current alcohol use. He reports that he does not use drugs.  No Known Allergies  History reviewed. No pertinent family history.   Prior to Admission medications    Medication Sig Start Date End Date Taking? Authorizing Provider  emtricitabine-tenofovir AF (DESCOVY) 200-25 MG tablet Take 1 tablet by mouth daily. 01/11/20   Kuppelweiser, Cassie L, RPH-CPP  emtricitabine-tenofovir AF (DESCOVY) 200-25 MG tablet TAKE 1 TABLET BY MOUTH DAILY. 01/06/20 01/05/21  Kuppelweiser, Cassie L, RPH-CPP  metFORMIN (GLUCOPHAGE) 500 MG tablet Take 1 tablet (500 mg total) by mouth 2 (two) times daily with a meal. 06/25/15   Alvira Monday, MD    Physical Exam: Vitals:   07/02/20 0030 07/02/20 0130 07/02/20 0145 07/02/20 0200  BP: (!) 161/88 (!) 157/91 (!) 161/96 (!) 167/102  Pulse: 70 70 76 68  Resp: 16 16 18 18   Temp:      TempSrc:      SpO2: 95% 97% 97% 97%  Height:        Constitutional: NAD, calm  Eyes: PERTLA, lids and conjunctivae normal ENMT: Mucous membranes are moist. Posterior pharynx clear of any exudate or lesions.   Neck: supple, no masses  Respiratory: no wheezing, no crackles. No accessory muscle use.  Cardiovascular: S1 & S2 heard, regular rate and rhythm. No extremity edema.  Abdomen: Soft, no distension, mild tenderness in epigastrium without rebound pain or guarding. Bowel sounds active.  Musculoskeletal: no clubbing / cyanosis. No joint deformity upper and lower extremities.   Skin: no significant rashes, lesions, ulcers. Warm, dry, well-perfused. Neurologic: CN 2-12 grossly intact. Sensation intact. Moving all extremities.  Psychiatric: Alert and oriented to person, place, and situation. Very pleasant and cooperative.  Labs and Imaging on Admission: I have personally reviewed following labs and imaging studies  CBC: Recent Labs  Lab 07/01/20 1839  WBC 8.9  NEUTROABS 6.7  HGB 16.5  HCT 48.4  MCV 93.1  PLT 242   Basic Metabolic Panel: Recent Labs  Lab 07/01/20 1839  NA 131*  K 4.5  CL 87*  CO2 27  GLUCOSE 352*  BUN 21*  CREATININE 0.68  CALCIUM 9.7   GFR: CrCl cannot be calculated (Unknown ideal weight.). Liver  Function Tests: Recent Labs  Lab 07/01/20 1839  AST 134*  ALT 201*  ALKPHOS 583*  BILITOT 11.5*  PROT 8.2*  ALBUMIN 3.8   Recent Labs  Lab 07/01/20 1839  LIPASE 527*   No results for input(s): AMMONIA in the last 168 hours. Coagulation Profile: No results for input(s): INR, PROTIME in the last 168 hours. Cardiac Enzymes: No results for input(s): CKTOTAL, CKMB, CKMBINDEX, TROPONINI in the last 168 hours. BNP (last 3 results) No results for input(s): PROBNP in the last 8760 hours. HbA1C: No results for input(s): HGBA1C in the last 72 hours. CBG: Recent Labs  Lab 07/01/20 1804  GLUCAP 383*   Lipid Profile: No results for input(s): CHOL, HDL, LDLCALC, TRIG, CHOLHDL, LDLDIRECT in the last 72 hours. Thyroid Function Tests: No results for input(s): TSH, T4TOTAL, FREET4, T3FREE, THYROIDAB in the last 72 hours. Anemia Panel: No results for input(s): VITAMINB12, FOLATE, FERRITIN, TIBC, IRON, RETICCTPCT in the last 72 hours. Urine analysis:    Component Value Date/Time   COLORURINE YELLOW 06/24/2015 1918   APPEARANCEUR CLEAR 06/24/2015 1918   LABSPEC 1.044 (H) 06/24/2015 1918   PHURINE 5.5 06/24/2015 1918   GLUCOSEU >1000 (A) 06/24/2015 1918   HGBUR SMALL (A) 06/24/2015 1918   BILIRUBINUR NEGATIVE 06/24/2015 1918   KETONESUR NEGATIVE 06/24/2015 1918   PROTEINUR NEGATIVE 06/24/2015 1918   UROBILINOGEN 0.2 08/14/2013 1655   NITRITE NEGATIVE 06/24/2015 1918   LEUKOCYTESUR NEGATIVE 06/24/2015 1918   Sepsis Labs: @LABRCNTIP (procalcitonin:4,lacticidven:4) ) Recent Results (from the past 240 hour(s))  Resp Panel by RT-PCR (Flu A&B, Covid) Nasopharyngeal Swab     Status: None   Collection Time: 07/01/20  9:45 PM   Specimen: Nasopharyngeal Swab; Nasopharyngeal(NP) swabs in vial transport medium  Result Value Ref Range Status   SARS Coronavirus 2 by RT PCR NEGATIVE NEGATIVE Final    Comment: (NOTE) SARS-CoV-2 target nucleic acids are NOT DETECTED.  The SARS-CoV-2 RNA is  generally detectable in upper respiratory specimens during the acute phase of infection. The lowest concentration of SARS-CoV-2 viral copies this assay can detect is 138 copies/mL. A negative result does not preclude SARS-Cov-2 infection and should not be used as the sole basis for treatment or other patient management decisions. A negative result may occur with  improper specimen collection/handling, submission of specimen other than nasopharyngeal swab, presence of viral mutation(s) within the areas targeted by this assay, and inadequate number of viral copies(<138 copies/mL). A negative result must be combined with clinical observations, patient history, and epidemiological information. The expected result is Negative.  Fact Sheet for Patients:  BloggerCourse.comhttps://www.fda.gov/media/152166/download  Fact Sheet for Healthcare Providers:  SeriousBroker.ithttps://www.fda.gov/media/152162/download  This test is no t yet approved or cleared by the [place] FDA and  has been authorized for detection and/or diagnosis of SARS-CoV-2 by FDA under an Emergency Use Authorization (EUA). This EUA will remain  in effect (meaning this test can be used) for the duration of the COVID-19 declaration under Section 564(b)(1) of the Act, 21 U.S.C.section 360bbb-3(b)(1),  unless the authorization is terminated  or revoked sooner.       Influenza A by PCR NEGATIVE NEGATIVE Final   Influenza B by PCR NEGATIVE NEGATIVE Final    Comment: (NOTE) The Xpert Xpress SARS-CoV-2/FLU/RSV plus assay is intended as an aid in the diagnosis of influenza from Nasopharyngeal swab specimens and should not be used as a sole basis for treatment. Nasal washings and aspirates are unacceptable for Xpert Xpress SARS-CoV-2/FLU/RSV testing.  Fact Sheet for Patients: BloggerCourse.com  Fact Sheet for Healthcare Providers: SeriousBroker.it  This test is not yet approved or cleared by the [place] FDA and has been authorized for detection and/or diagnosis of SARS-CoV-2 by FDA under an Emergency Use Authorization (EUA). This EUA will remain in effect (meaning this test can be used) for the duration of the COVID-19 declaration under Section 564(b)(1) of the Act, 21 U.S.C. section 360bbb-3(b)(1), unless the authorization is terminated or revoked.  Performed at [facility], [address], [place], [place] [address]      Radiological Exams on Admission: CT ABDOMEN PELVIS W CONTRAST  Result Date: 07/02/2020 CLINICAL DATA:  Acute pancreatitis, vomiting, unspecified abdominal pain EXAM: CT ABDOMEN AND PELVIS WITH CONTRAST TECHNIQUE: Multidetector CT imaging of the abdomen and pelvis was performed using the standard protocol following bolus administration of intravenous contrast. CONTRAST:  OMNIPAQUE IOHEXOL 300 MG/ML  SOLN COMPARISON:  None. FINDINGS: Lower chest: No acute abnormality. Hepatobiliary: Mild hepatic steatosis. No focal intrahepatic enhancing mass identified. No intra or extrahepatic biliary ductal dilation. Cholecystectomy has been performed. Pancreas: Unremarkable. No pancreatic ductal dilatation or surrounding inflammatory changes. Spleen: Unremarkable Adrenals/Urinary Tract: Adrenal glands are unremarkable. Kidneys are normal, without renal calculi, focal lesion, or hydronephrosis. Bladder is mildly distended but is otherwise unremarkable. Stomach/Bowel: Stomach is within normal limits. Appendix appears normal. No evidence of bowel wall thickening, distention, or inflammatory changes. No free intraperitoneal gas or fluid. Vascular/Lymphatic: No significant vascular findings are present. No enlarged abdominal or pelvic lymph nodes. Reproductive: Prostate is unremarkable. Other: No abdominal wall hernia identified. The rectum is unremarkable. Musculoskeletal: No acute bone abnormality. No lytic or blastic bone lesions. Osseous structures are  age-appropriate. IMPRESSION: No acute intra-abdominal pathology identified. Specifically, normal examination of the pancreas. No definite radiographic explanation for the patient's reported symptoms. Mild hepatic steatosis Electronically Signed   By: Helyn Numbers MD   On: 07/02/2020 01:31   US Abdomen Limited  Result Date: 07/01/2020 CLINICAL DATA:  RIGHT upper quadrant pain. EXAM: ULTRASOUND ABDOMEN LIMITED RIGHT UPPER QUADRANT COMPARISON:  None. FINDINGS: Gallbladder: No gallstones or wall thickening visualized. No sonographic Murphy sign noted by sonographer. Common bile duct: Diameter: 6 mm Liver: No focal lesion identified. Within normal limits in parenchymal echogenicity. Portal vein is patent on color Doppler imaging with normal direction of blood flow towards the liver. Other: None. IMPRESSION: Normal RIGHT upper quadrant ultrasound. Electronically Signed   By: Bary Richard M.D.   On: 07/01/2020 23:08    Assessment/Plan   1. Acute liver disease; pancreatitis  - Presents with 3-4 days of upper pain and N/V and is found to have lipase 527 and elevated LFTs in cholestatic pattern with total bilirubin 11.5  - RUQ [place] was normal and no acute findings noted on CT abd/pelvis  - Viral hepatitis panel pending, will also fractionate bilirubin, check acetaminophen and ethanol levels, INR, GGT, and HIV while trending LFTs and continuing IVF hydration, clear liquid diet, and supportive care  - Consider MRCP if pending labs  unrevealing   2. Uncontrolled type II DM  - Serum glucose is 352 on admission in setting of medication non-adherence  - Check CBGs and use SSI for now    DVT prophylaxis: Lovenox  Code Status: Full  Level of Care: Level of care: Med-Surg Family Communication: None present  Disposition Plan:  Patient is from: Home  Anticipated d/c is to: Home  Anticipated d/c date is: 6/7 or 07/05/20 Patient currently: Pending additional blood work, possibly MRCP and/or GI consultation    Consults called: None  Admission status: Inpatient     Briscoe Deutscher, MD [facility]  07/02/2020, 3:20 AM

## 2020-07-02 NOTE — ED Provider Notes (Signed)
Patient signed out to me by Dr. Freida Busman.  Patient with 3-day history of upper abdominal pain and vomiting.  Patient with history of diabetes and HIV.  Patient found to have diffusely elevated LFTs and lipase.  Ultrasound of right upper quadrant did not show any acute abnormality.  No gallstones, no biliary dilatation.  CT scan also performed does not show any acute pathology.  Patient now having more focal left upper quadrant pain radiating into the back.  Acute hepatitis panel sent.  Will admit for further evaluation.   Gilda Crease, MD 07/02/20 [phone]

## 2020-07-02 NOTE — Progress Notes (Addendum)
Progress Note    Lawrence Mccormick  SRP:[id number] DOB: 1980-03-14  DOA: 07/01/2020 PCP: Pcp, No (Inactive)      Brief Narrative:    Medical records reviewed and are as summarized below:  Lawrence Mccormick is a 40 y.o. male with medical history significant for type II DM, on preexposure HIV prophylaxis because of high risk for contracting HIV, who presented to the hospital because of nausea, vomiting and abdominal pain for 3 days duration.      Assessment/Plan:   Principal Problem:   Jaundice Active Problems:   Pancreatitis   Uncontrolled diabetes mellitus with hyperglycemia (HCC)   Body mass index is 26.73 kg/m.   Acute pancreatitis, elevated liver enzymes: Continue clear liquid diet.  Obtain MRCP of abdomen with contrast for further evaluation.  Monitor liver enzymes.  Analgesics needed for pain.  IV fluids has been discontinued.  He said he has history of gallstones.  He said he quit drinking alcohol in 2003.  Type II DM with hyperglycemia: Hemoglobin A1c is pending.  Use NovoLog as needed for hyperglycemia.  Hypertension, hypertensive urgency: He will likely benefit from ACE inhibitor because of diabetes mellitus   Diet Order            Diet clear liquid Room service appropriate? Yes; Fluid consistency: Thin  Diet effective now                    Consultants:  None  Procedures:  None    Medications:   . enoxaparin (LOVENOX) injection  40 mg Subcutaneous Q24H  . insulin aspart  0-9 Units Subcutaneous Q4H   Continuous Infusions: . sodium chloride 200 mL/hr at 07/02/20 2446     Anti-infectives (From admission, onward)   None             Family Communication/Anticipated D/C date and plan/Code Status   DVT prophylaxis: enoxaparin (LOVENOX) injection 40 mg Start: 07/02/20 1000     Code Status: Full Code  Family Communication: None Disposition Plan:    Status is: Inpatient  Remains inpatient appropriate because:Inpatient  level of care appropriate due to severity of illness   Dispo: The patient is from: Home              Anticipated d/c is to: Home              Patient currently is not medically stable to d/c.   Difficult to place patient No           Subjective:   C/o abdominal pain graded as 4/10.  No fever, diarrhea, vomiting or nausea.   Objective:    Vitals:   07/02/20 0200 07/02/20 0300 07/02/20 0330 07/02/20 0354  BP: (!) 167/102 (!) 148/92  (!) 150/97  Pulse: 68 73 74 72  Resp: 18  16 18   Temp:   97.9 F (36.6 C) 99.1 F (37.3 C)  TempSrc:    Oral  SpO2: 97% 97% 97% 99%  Height:       No data found.   Intake/Output Summary (Last 24 hours) at 07/02/2020 1227 Last data filed at 07/02/2020 1000 Gross per 24 hour  Intake 2768.9 ml  Output 0 ml  Net 2768.9 ml   There were no vitals filed for this visit.  Exam:  GEN: NAD SKIN: Warm and dry EYES: EOMI ENT: MMM CV: RRR PULM: CTA B ABD: soft, ND, NT, +BS CNS: AAO x 3, non focal EXT: No edema or tenderness  Data Reviewed:   I have personally reviewed following labs and imaging studies:  Labs: Labs show the following:   Basic Metabolic Panel: Recent Labs  Lab 07/01/20 1839 07/02/20 0333  NA 131* 130*  K 4.5 4.2  CL 87* 89*  CO2 27 29  GLUCOSE 352* 323*  BUN 21* 22*  CREATININE 0.68 0.79  CALCIUM 9.7 9.3   GFR CrCl cannot be calculated (Unknown ideal weight.). Liver Function Tests: Recent Labs  Lab 07/01/20 1839 07/02/20 0255 07/02/20 0333  AST 134*  --  99*  ALT 201*  --  169*  ALKPHOS 583*  --  550*  BILITOT 11.5* 5.4* 5.5*  PROT 8.2*  --  7.9  ALBUMIN 3.8  --  3.5   Recent Labs  Lab 07/01/20 1839  LIPASE 527*   No results for input(s): AMMONIA in the last 168 hours. Coagulation profile Recent Labs  Lab 07/02/20 0255  INR 0.9    CBC: Recent Labs  Lab 07/01/20 1839 07/02/20 0333  WBC 8.9 8.7  NEUTROABS 6.7  --   HGB 16.5 15.2  HCT 48.4 45.4  MCV 93.1 93.0  PLT  242 235   Cardiac Enzymes: No results for input(s): CKTOTAL, CKMB, CKMBINDEX, TROPONINI in the last 168 hours. BNP (last 3 results) No results for input(s): PROBNP in the last 8760 hours. CBG: Recent Labs  Lab 07/01/20 1804 07/02/20 0349 07/02/20 0741  GLUCAP 383* 352* 228*   D-Dimer: No results for input(s): DDIMER in the last 72 hours. Hgb A1c: No results for input(s): HGBA1C in the last 72 hours. Lipid Profile: No results for input(s): CHOL, HDL, LDLCALC, TRIG, CHOLHDL, LDLDIRECT in the last 72 hours. Thyroid function studies: No results for input(s): TSH, T4TOTAL, T3FREE, THYROIDAB in the last 72 hours.  Invalid input(s): FREET3 Anemia work up: No results for input(s): VITAMINB12, FOLATE, FERRITIN, TIBC, IRON, RETICCTPCT in the last 72 hours. Sepsis Labs: Recent Labs  Lab 07/01/20 1839 07/02/20 0333  WBC 8.9 8.7    Microbiology Recent Results (from the past 240 hour(s))  Resp Panel by RT-PCR (Flu A&B, Covid) Nasopharyngeal Swab     Status: None   Collection Time: 07/01/20  9:45 PM   Specimen: Nasopharyngeal Swab; Nasopharyngeal(NP) swabs in vial transport medium  Result Value Ref Range Status   SARS Coronavirus 2 by RT PCR NEGATIVE NEGATIVE Final    Comment: (NOTE) SARS-CoV-2 target nucleic acids are NOT DETECTED.  The SARS-CoV-2 RNA is generally detectable in upper respiratory specimens during the acute phase of infection. The lowest concentration of SARS-CoV-2 viral copies this assay can detect is 138 copies/mL. A negative result does not preclude SARS-Cov-2 infection and should not be used as the sole basis for treatment or other patient management decisions. A negative result may occur with  improper specimen collection/handling, submission of specimen other than nasopharyngeal swab, presence of viral mutation(s) within the areas targeted by this assay, and inadequate number of viral copies(<138 copies/mL). A negative result must be combined with clinical  observations, patient history, and epidemiological information. The expected result is Negative.  Fact Sheet for Patients:  BloggerCourse.com  Fact Sheet for Healthcare Providers:  SeriousBroker.it  This test is no t yet approved or cleared by the [place] FDA and  has been authorized for detection and/or diagnosis of SARS-CoV-2 by FDA under an Emergency Use Authorization (EUA). This EUA will remain  in effect (meaning this test can be used) for the duration of the COVID-19 declaration under Section 564(b)(1) of the  Act, 21 U.S.C.section 360bbb-3(b)(1), unless the authorization is terminated  or revoked sooner.       Influenza A by PCR NEGATIVE NEGATIVE Final   Influenza B by PCR NEGATIVE NEGATIVE Final    Comment: (NOTE) The Xpert Xpress SARS-CoV-2/FLU/RSV plus assay is intended as an aid in the diagnosis of influenza from Nasopharyngeal swab specimens and should not be used as a sole basis for treatment. Nasal washings and aspirates are unacceptable for Xpert Xpress SARS-CoV-2/FLU/RSV testing.  Fact Sheet for Patients: BloggerCourse.com  Fact Sheet for Healthcare Providers: SeriousBroker.it  This test is not yet approved or cleared by the [place] FDA and has been authorized for detection and/or diagnosis of SARS-CoV-2 by FDA under an Emergency Use Authorization (EUA). This EUA will remain in effect (meaning this test can be used) for the duration of the COVID-19 declaration under Section 564(b)(1) of the Act, 21 U.S.C. section 360bbb-3(b)(1), unless the authorization is terminated or revoked.  Performed at [facility], [address], [place], [place] [address]     Procedures and diagnostic studies:  CT ABDOMEN PELVIS W CONTRAST  Result Date: 07/02/2020 CLINICAL DATA:  Acute pancreatitis, vomiting, unspecified abdominal pain EXAM: CT  ABDOMEN AND PELVIS WITH CONTRAST TECHNIQUE: Multidetector CT imaging of the abdomen and pelvis was performed using the standard protocol following bolus administration of intravenous contrast. CONTRAST:  OMNIPAQUE IOHEXOL 300 MG/ML  SOLN COMPARISON:  None. FINDINGS: Lower chest: No acute abnormality. Hepatobiliary: Mild hepatic steatosis. No focal intrahepatic enhancing mass identified. No intra or extrahepatic biliary ductal dilation. Cholecystectomy has been performed. Pancreas: Unremarkable. No pancreatic ductal dilatation or surrounding inflammatory changes. Spleen: Unremarkable Adrenals/Urinary Tract: Adrenal glands are unremarkable. Kidneys are normal, without renal calculi, focal lesion, or hydronephrosis. Bladder is mildly distended but is otherwise unremarkable. Stomach/Bowel: Stomach is within normal limits. Appendix appears normal. No evidence of bowel wall thickening, distention, or inflammatory changes. No free intraperitoneal gas or fluid. Vascular/Lymphatic: No significant vascular findings are present. No enlarged abdominal or pelvic lymph nodes. Reproductive: Prostate is unremarkable. Other: No abdominal wall hernia identified. The rectum is unremarkable. Musculoskeletal: No acute bone abnormality. No lytic or blastic bone lesions. Osseous structures are age-appropriate. IMPRESSION: No acute intra-abdominal pathology identified. Specifically, normal examination of the pancreas. No definite radiographic explanation for the patient's reported symptoms. Mild hepatic steatosis Electronically Signed   By: Helyn Numbers MD   On: 07/02/2020 01:31   US Abdomen Limited  Result Date: 07/01/2020 CLINICAL DATA:  RIGHT upper quadrant pain. EXAM: ULTRASOUND ABDOMEN LIMITED RIGHT UPPER QUADRANT COMPARISON:  None. FINDINGS: Gallbladder: No gallstones or wall thickening visualized. No sonographic Murphy sign noted by sonographer. Common bile duct: Diameter: 6 mm Liver: No focal lesion identified. Within  normal limits in parenchymal echogenicity. Portal vein is patent on color Doppler imaging with normal direction of blood flow towards the liver. Other: None. IMPRESSION: Normal RIGHT upper quadrant ultrasound. Electronically Signed   By: Bary Richard M.D.   On: 07/01/2020 23:08               LOS: 0 days   Kamarius Buckbee  [facility]   Pager on [email or web address]. If 7PM-7AM, please contact night-coverage at [email or web address]     07/02/2020, 12:27 PM

## 2020-07-02 NOTE — ED Notes (Signed)
ED TO INPATIENT HANDOFF REPORT  Name/Age/Gender Lawrence Mccormick 40 y.o. male  Code Status    Code Status Orders  (From admission, onward)         Start     Ordered   07/02/20 0311  Full code  Continuous        07/02/20 0311        Code Status History    Date Active Date Inactive Code Status Order ID Comments User Context   08/13/2013 1838 08/20/2013 1754 Full Code [id number]  Nanine Means, NP Inpatient   08/13/2013 1359 08/13/2013 1838 Full Code [id number]  Emilia Beck, PA-C ED   Advance Care Planning Activity      Home/SNF/Other Home  Chief Complaint Jaundice [R17]  Level of Care/Admitting Diagnosis ED Disposition    ED Disposition Condition Comment   Admit  Hospital Area: [facility] [[id number]]  Level of Care: Med-Surg [16]  May admit patient to [facility] or [facility] if equivalent level of care is available:: Yes  Covid Evaluation: Confirmed COVID Negative  Diagnosis: Jaundice [242209]  Admitting Physician: Briscoe Deutscher [[id number]]  Attending Physician: Briscoe Deutscher [[id number]]  Estimated length of stay: past midnight tomorrow  Certification:: I certify this patient will need inpatient services for at least 2 midnights       Medical History Past Medical History:  Diagnosis Date  . Diabetes mellitus without complication (HCC)     Allergies No Known Allergies  IV Location/Drains/Wounds Patient Lines/Drains/Airways Status    Active Line/Drains/Airways    Name Placement date Placement time Site Days   Peripheral IV 06/24/15 Right Forearm 06/24/15  1935  Forearm  1835   Peripheral IV 07/01/20 20 G Left Antecubital 07/01/20  2159  Antecubital  1          Labs/Imaging Results for orders placed or performed during the hospital encounter of 07/01/20 (from the past 48 hour(s))  CBG monitoring, ED     Status: Abnormal   Collection Time: 07/01/20  6:04 PM  Result Value Ref Range   Glucose-Capillary 383 (H) 70 - 99 mg/dL     Comment: Glucose reference range applies only to samples taken after fasting for at least 8 hours.   Comment 1 Notify RN   Comprehensive metabolic panel     Status: Abnormal   Collection Time: 07/01/20  6:39 PM  Result Value Ref Range   Sodium 131 (L) 135 - 145 mmol/L   Potassium 4.5 3.5 - 5.1 mmol/L   Chloride 87 (L) 98 - 111 mmol/L   CO2 27 22 - 32 mmol/L   Glucose, Bld 352 (H) 70 - 99 mg/dL    Comment: Glucose reference range applies only to samples taken after fasting for at least 8 hours.   BUN 21 (H) 6 - 20 mg/dL   Creatinine, Ser 1.69 0.61 - 1.24 mg/dL   Calcium 9.7 8.9 - 67.8 mg/dL   Total Protein 8.2 (H) 6.5 - 8.1 g/dL   Albumin 3.8 3.5 - 5.0 g/dL   AST 938 (H) 15 - 41 U/L   ALT 201 (H) 0 - 44 U/L   Alkaline Phosphatase 583 (H) 38 - 126 U/L   Total Bilirubin 11.5 (H) 0.3 - 1.2 mg/dL   GFR, Estimated >10 >17 mL/min    Comment: (NOTE) Calculated using the CKD-EPI Creatinine Equation (2021)    Anion gap 17 (H) 5 - 15    Comment: Performed at [facility], [address], [place] [address]  Lipase, blood     Status: Abnormal   Collection Time: 07/01/20  6:39 PM  Result Value Ref Range   Lipase 527 (H) 11 - 51 U/L    Comment: RESULTS CONFIRMED BY MANUAL DILUTION Performed at [facility], [address], [place], [place] [address]   CBC with Differential     Status: None   Collection Time: 07/01/20  6:39 PM  Result Value Ref Range   WBC 8.9 4.0 - 10.5 K/uL   RBC 5.20 4.22 - 5.81 MIL/uL   Hemoglobin 16.5 13.0 - 17.0 g/dL   HCT 60.6 30.1 - 60.1 %   MCV 93.1 80.0 - 100.0 fL   MCH 31.7 26.0 - 34.0 pg   MCHC 34.1 30.0 - 36.0 g/dL   RDW 09.3 23.5 - 57.3 %   Platelets 242 150 - 400 K/uL   nRBC 0.0 0.0 - 0.2 %   Neutrophils Relative % 76 %   Neutro Abs 6.7 1.7 - 7.7 K/uL   Lymphocytes Relative 15 %   Lymphs Abs 1.4 0.7 - 4.0 K/uL   Monocytes Relative 8 %   Monocytes Absolute 0.7 0.1 - 1.0 K/uL   Eosinophils Relative 0 %    Eosinophils Absolute 0.0 0.0 - 0.5 K/uL   Basophils Relative 0 %   Basophils Absolute 0.0 0.0 - 0.1 K/uL   Immature Granulocytes 1 %   Abs Immature Granulocytes 0.04 0.00 - 0.07 K/uL    Comment: Performed at [facility], [address], [place], [place] [address]  Resp Panel by RT-PCR (Flu A&B, Covid) Nasopharyngeal Swab     Status: None   Collection Time: 07/01/20  9:45 PM   Specimen: Nasopharyngeal Swab; Nasopharyngeal(NP) swabs in vial transport medium  Result Value Ref Range   SARS Coronavirus 2 by RT PCR NEGATIVE NEGATIVE    Comment: (NOTE) SARS-CoV-2 target nucleic acids are NOT DETECTED.  The SARS-CoV-2 RNA is generally detectable in upper respiratory specimens during the acute phase of infection. The lowest concentration of SARS-CoV-2 viral copies this assay can detect is 138 copies/mL. A negative result does not preclude SARS-Cov-2 infection and should not be used as the sole basis for treatment or other patient management decisions. A negative result may occur with  improper specimen collection/handling, submission of specimen other than nasopharyngeal swab, presence of viral mutation(s) within the areas targeted by this assay, and inadequate number of viral copies(<138 copies/mL). A negative result must be combined with clinical observations, patient history, and epidemiological information. The expected result is Negative.  Fact Sheet for Patients:  BloggerCourse.com  Fact Sheet for Healthcare Providers:  SeriousBroker.it  This test is no t yet approved or cleared by the [place] FDA and  has been authorized for detection and/or diagnosis of SARS-CoV-2 by FDA under an Emergency Use Authorization (EUA). This EUA will remain  in effect (meaning this test can be used) for the duration of the COVID-19 declaration under Section 564(b)(1) of the Act, 21 U.S.C.section 360bbb-3(b)(1), unless the  authorization is terminated  or revoked sooner.       Influenza A by PCR NEGATIVE NEGATIVE   Influenza B by PCR NEGATIVE NEGATIVE    Comment: (NOTE) The Xpert Xpress SARS-CoV-2/FLU/RSV plus assay is intended as an aid in the diagnosis of influenza from Nasopharyngeal swab specimens and should not be used as a sole basis for treatment. Nasal washings and aspirates are unacceptable for Xpert Xpress SARS-CoV-2/FLU/RSV testing.  Fact Sheet  for Patients: BloggerCourse.com  Fact Sheet for Healthcare Providers: SeriousBroker.it  This test is not yet approved or cleared by the [place] FDA and has been authorized for detection and/or diagnosis of SARS-CoV-2 by FDA under an Emergency Use Authorization (EUA). This EUA will remain in effect (meaning this test can be used) for the duration of the COVID-19 declaration under Section 564(b)(1) of the Act, 21 U.S.C. section 360bbb-3(b)(1), unless the authorization is terminated or revoked.  Performed at [facility], [address] [place], [place] [address]    CT ABDOMEN PELVIS W CONTRAST  Result Date: 07/02/2020 CLINICAL DATA:  Acute pancreatitis, vomiting, unspecified abdominal pain EXAM: CT ABDOMEN AND PELVIS WITH CONTRAST TECHNIQUE: Multidetector CT imaging of the abdomen and pelvis was performed using the standard protocol following bolus administration of intravenous contrast. CONTRAST:  OMNIPAQUE IOHEXOL 300 MG/ML  SOLN COMPARISON:  None. FINDINGS: Lower chest: No acute abnormality. Hepatobiliary: Mild hepatic steatosis. No focal intrahepatic enhancing mass identified. No intra or extrahepatic biliary ductal dilation. Cholecystectomy has been performed. Pancreas: Unremarkable. No pancreatic ductal dilatation or surrounding inflammatory changes. Spleen: Unremarkable Adrenals/Urinary Tract: Adrenal glands are unremarkable. Kidneys are normal, without renal calculi,  focal lesion, or hydronephrosis. Bladder is mildly distended but is otherwise unremarkable. Stomach/Bowel: Stomach is within normal limits. Appendix appears normal. No evidence of bowel wall thickening, distention, or inflammatory changes. No free intraperitoneal gas or fluid. Vascular/Lymphatic: No significant vascular findings are present. No enlarged abdominal or pelvic lymph nodes. Reproductive: Prostate is unremarkable. Other: No abdominal wall hernia identified. The rectum is unremarkable. Musculoskeletal: No acute bone abnormality. No lytic or blastic bone lesions. Osseous structures are age-appropriate. IMPRESSION: No acute intra-abdominal pathology identified. Specifically, normal examination of the pancreas. No definite radiographic explanation for the patient's reported symptoms. Mild hepatic steatosis Electronically Signed   By: Helyn Numbers MD   On: 07/02/2020 01:31   US Abdomen Limited  Result Date: 07/01/2020 CLINICAL DATA:  RIGHT upper quadrant pain. EXAM: ULTRASOUND ABDOMEN LIMITED RIGHT UPPER QUADRANT COMPARISON:  None. FINDINGS: Gallbladder: No gallstones or wall thickening visualized. No sonographic Murphy sign noted by sonographer. Common bile duct: Diameter: 6 mm Liver: No focal lesion identified. Within normal limits in parenchymal echogenicity. Portal vein is patent on color Doppler imaging with normal direction of blood flow towards the liver. Other: None. IMPRESSION: Normal RIGHT upper quadrant ultrasound. Electronically Signed   By: Bary Richard M.D.   On: 07/01/2020 23:08    Pending Labs Unresulted Labs (From admission, onward)          Start     Ordered   07/09/20 0500  Creatinine, serum  (enoxaparin (LOVENOX)    CrCl >/= 30 ml/min)  Weekly,   R     Comments: while on enoxaparin therapy    07/02/20 0311   07/03/20 0500  Magnesium  Tomorrow morning,   R        07/02/20 0312   07/02/20 0500  Comprehensive metabolic panel  Daily,   R      07/02/20 0311   07/02/20 0500   CBC  Daily,   R      07/02/20 0311   07/02/20 0312  Ethanol  Add-on,   AD        07/02/20 0312   07/02/20 0310  Hemoglobin A1c  Once,   STAT       Comments: To assess prior glycemic control    07/02/20 0311   07/02/20 0256  Bilirubin, fractionated(tot/dir/indir)  Once,  STAT        07/02/20 0255   07/02/20 0256  Gamma GT  Once,   STAT        07/02/20 0255   07/02/20 0256  HIV Antibody (routine testing w rflx)  (HIV Antibody (Routine testing w reflex) panel)  Once,   STAT        07/02/20 0255   07/02/20 0255  Acetaminophen level  Once,   STAT        07/02/20 0255   07/02/20 0255  Protime-INR  Once,   STAT        07/02/20 0255   07/02/20 0234  Hepatitis panel, acute  ONCE - STAT,   STAT        07/02/20 0233          Vitals/Pain Today's Vitals   07/02/20 0130 07/02/20 0145 07/02/20 0200 07/02/20 0305  BP: (!) 157/91 (!) 161/96 (!) 167/102   Pulse: 70 76 68   Resp: 16 18 18    Temp:      TempSrc:      SpO2: 97% 97% 97%   Height:      PainSc:    4     Isolation Precautions No active isolations  Medications Medications  sodium chloride (PF) 0.9 % injection (has no administration in time range)  0.9 %  sodium chloride infusion (has no administration in time range)  insulin aspart (novoLOG) injection 0-9 Units (has no administration in time range)  enoxaparin (LOVENOX) injection 40 mg (has no administration in time range)  fentaNYL (SUBLIMAZE) injection 25-50 mcg (has no administration in time range)  ondansetron (ZOFRAN) tablet 4 mg (has no administration in time range)    Or  ondansetron (ZOFRAN) injection 4 mg (has no administration in time range)  hydrALAZINE (APRESOLINE) tablet 25 mg (has no administration in time range)  lactated ringers bolus 1,000 mL (0 mLs Intravenous Stopped 07/01/20 2253)  morphine 4 MG/ML injection 6 mg (6 mg Intravenous Given 07/01/20 2204)  ondansetron (ZOFRAN) injection 4 mg (4 mg Intravenous Given 07/01/20 2208)  iohexol (OMNIPAQUE) 300 MG/ML  solution 100 mL (100 mLs Intravenous Contrast Given 07/02/20 0037)  morphine 4 MG/ML injection 4 mg (4 mg Intravenous Given 07/02/20 0243)    Mobility Independent

## 2020-07-03 ENCOUNTER — Other Ambulatory Visit (HOSPITAL_COMMUNITY): Payer: Self-pay

## 2020-07-03 LAB — COMPREHENSIVE METABOLIC PANEL
ALT: 107 U/L — ABNORMAL HIGH (ref 0–44)
AST: 56 U/L — ABNORMAL HIGH (ref 15–41)
Albumin: 3 g/dL — ABNORMAL LOW (ref 3.5–5.0)
Alkaline Phosphatase: 416 U/L — ABNORMAL HIGH (ref 38–126)
Anion gap: 8 (ref 5–15)
BUN: 11 mg/dL (ref 6–20)
CO2: 30 mmol/L (ref 22–32)
Calcium: 8.7 mg/dL — ABNORMAL LOW (ref 8.9–10.3)
Chloride: 97 mmol/L — ABNORMAL LOW (ref 98–111)
Creatinine, Ser: 0.67 mg/dL (ref 0.61–1.24)
GFR, Estimated: >60 mL/min (ref >60)
Glucose, Bld: 143 mg/dL — ABNORMAL HIGH (ref 70–99)
Potassium: 3.1 mmol/L — ABNORMAL LOW (ref 3.5–5.1)
Sodium: 135 mmol/L (ref 135–145)
Total Bilirubin: 2.6 mg/dL — ABNORMAL HIGH (ref 0.3–1.2)
Total Protein: 6.7 g/dL (ref 6.5–8.1)

## 2020-07-03 LAB — CBC
HCT: 42.3 % (ref 39.0–52.0)
Hemoglobin: 14.2 g/dL (ref 13.0–17.0)
MCH: 31.2 pg (ref 26.0–34.0)
MCHC: 33.6 g/dL (ref 30.0–36.0)
MCV: 93 fL (ref 80.0–100.0)
Platelets: 214 10*3/uL (ref 150–400)
RBC: 4.55 MIL/uL (ref 4.22–5.81)
RDW: 12.1 % (ref 11.5–15.5)
WBC: 6.5 10*3/uL (ref 4.0–10.5)
nRBC: 0 % (ref 0.0–0.2)

## 2020-07-03 LAB — LIPASE, BLOOD: Lipase: 51 U/L (ref 11–51)

## 2020-07-03 LAB — HEMOGLOBIN A1C
Hgb A1c MFr Bld: 11.5 % — ABNORMAL HIGH (ref 4.8–5.6)
Mean Plasma Glucose: 283 mg/dL

## 2020-07-03 LAB — MAGNESIUM: Magnesium: 1.8 mg/dL (ref 1.7–2.4)

## 2020-07-03 LAB — GLUCOSE, CAPILLARY
Glucose-Capillary: 271 mg/dL — ABNORMAL HIGH (ref 70–99)
Glucose-Capillary: 157 mg/dL — ABNORMAL HIGH (ref 70–99)
Glucose-Capillary: 127 mg/dL — ABNORMAL HIGH (ref 70–99)
Glucose-Capillary: 179 mg/dL — ABNORMAL HIGH (ref 70–99)

## 2020-07-03 MED ORDER — LISINOPRIL 10 MG PO TABS: 10.0000 mg | ORAL_TABLET | Freq: Every day | ORAL | Status: DC

## 2020-07-03 MED ORDER — METFORMIN HCL 850 MG PO TABS
850.0000 mg | ORAL_TABLET | Freq: Two times a day (BID) | ORAL | 0 refills | Status: DC
  Filled 2020-07-03: qty 60, 30d supply, fill #0

## 2020-07-03 MED ORDER — POTASSIUM CHLORIDE CRYS ER 20 MEQ PO TBCR
40.0000 meq | EXTENDED_RELEASE_TABLET | ORAL | Status: DC
  Administered 2020-07-03: 40 meq via ORAL
  Filled 2020-07-03: qty 2

## 2020-07-03 MED ORDER — LISINOPRIL 10 MG PO TABS
10.0000 mg | ORAL_TABLET | Freq: Every day | ORAL | 0 refills | Status: DC
  Filled 2020-07-03: qty 30, 30d supply, fill #0

## 2020-07-03 NOTE — Progress Notes (Signed)
Discharge instructions discussed with patient, verbalized agreement and understanding 

## 2020-07-03 NOTE — Discharge Summary (Addendum)
Physician Discharge Summary  Lawrence Mccormick ZOX:[id number] DOB: 12/18/1980 DOA: 07/01/2020  PCP: Pcp, No (Inactive)  Admit date: 07/01/2020 Discharge date: 07/03/2020  Discharge disposition: Home   Recommendations for Outpatient Follow-Up:   Follow-up with PCP in 1 to 2 weeks   Discharge Diagnosis:   Principal Problem:   Jaundice Active Problems:   Pancreatitis   Uncontrolled diabetes mellitus with hyperglycemia (HCC)    Discharge Condition: Stable.  Diet recommendation:  Diet Order            Diet Carb Modified Fluid consistency: Thin; Room service appropriate? Yes  Diet effective now           Diet - low sodium heart healthy           Diet Carb Modified                   Code Status: Full Code     Hospital Course:   Mr. Lawrence Mccormick is a 40 y.o. male with medical history significant for type II DM, cholecystectomy, on preexposure HIV prophylaxis because of high risk for contracting HIV, who presented to the hospital because of nausea, vomiting and abdominal pain of 3 days duration.  He has liver enzymes and lipase levels were elevated.  Bilirubin was 11.5 on admission and lipase was 527.    He was admitted to the hospital for acute pancreatitis. No acute abnormality was noted on abdominal imaging including, abdominal ultrasound, CT abdomen and pelvis and MRCP abdomen.  He was treated with analgesics, IV fluids and antiemetics.  He has type II DM and he was hyperglycemic on admission.  Hemoglobin A1c was 11.5.  He was also hypertensive.  He said he had not taken his metformin for months.  He is reluctant to use insulin at home.  He was started on lisinopril for hypertension.  Close follow-up with PCP for routine health maintenance was strongly recommended.  The importance of medical adherence and adequate glucose and BP control were emphasized.   Liver enzymes have improved and lipase level has normalized.  He was able to tolerate solid food without any  recurrence of symptoms.  His condition has improved and he is deemed stable for discharge to home today.     Discharge Exam:    Vitals:   07/02/20 1329 07/02/20 2011 07/03/20 0441 07/03/20 1328  BP: (!) 172/113 (!) 164/106 (!) 131/93 (!) 162/109  Pulse: 60 65 65 81  Resp: 18 16 18 18   Temp: 98.4 F (36.9 C) 98.4 F (36.9 C) 98.4 F (36.9 C) 99.5 F (37.5 C)  TempSrc: Oral Oral Oral Oral  SpO2: 100% 100% 98% 100%  Height:         GEN: NAD SKIN: Warm dry EYES: No pallor or icterus ENT: MMM CV: RRR PULM: CTA B ABD: soft, ND, NT, +BS CNS: AAO x 3, non focal EXT: No edema or tenderness   The results of significant diagnostics from this hospitalization (including imaging, microbiology, ancillary and laboratory) are listed below for reference.     Procedures and Diagnostic Studies:   CT ABDOMEN PELVIS W CONTRAST  Result Date: 07/02/2020 CLINICAL DATA:  Acute pancreatitis, vomiting, unspecified abdominal pain EXAM: CT ABDOMEN AND PELVIS WITH CONTRAST TECHNIQUE: Multidetector CT imaging of the abdomen and pelvis was performed using the standard protocol following bolus administration of intravenous contrast. CONTRAST:  100mL OMNIPAQUE IOHEXOL 300 MG/ML  SOLN COMPARISON:  None. FINDINGS: Lower chest: No acute abnormality. Hepatobiliary: Mild hepatic steatosis.  No focal intrahepatic enhancing mass identified. No intra or extrahepatic biliary ductal dilation. Cholecystectomy has been performed. Pancreas: Unremarkable. No pancreatic ductal dilatation or surrounding inflammatory changes. Spleen: Unremarkable Adrenals/Urinary Tract: Adrenal glands are unremarkable. Kidneys are normal, without renal calculi, focal lesion, or hydronephrosis. Bladder is mildly distended but is otherwise unremarkable. Stomach/Bowel: Stomach is within normal limits. Appendix appears normal. No evidence of bowel wall thickening, distention, or inflammatory changes. No free intraperitoneal gas or fluid.  Vascular/Lymphatic: No significant vascular findings are present. No enlarged abdominal or pelvic lymph nodes. Reproductive: Prostate is unremarkable. Other: No abdominal wall hernia identified. The rectum is unremarkable. Musculoskeletal: No acute bone abnormality. No lytic or blastic bone lesions. Osseous structures are age-appropriate. IMPRESSION: No acute intra-abdominal pathology identified. Specifically, normal examination of the pancreas. No definite radiographic explanation for the patient's reported symptoms. Mild hepatic steatosis Electronically Signed   By: Helyn Numbers MD   On: 07/02/2020 01:31   MR 3D Recon At Scanner  Result Date: 07/02/2020 CLINICAL DATA:  Jaundice, acute pancreatitis, nausea, vomiting EXAM: MRI ABDOMEN WITHOUT AND WITH CONTRAST (INCLUDING MRCP) TECHNIQUE: Multiplanar multisequence MR imaging of the abdomen was performed both before and after the administration of intravenous contrast. Heavily T2-weighted images of the biliary and pancreatic ducts were obtained, and three-dimensional MRCP images were rendered by post processing. CONTRAST:  31mL GADAVIST GADOBUTROL 1 MMOL/ML IV SOLN COMPARISON:  CT abdomen pelvis, 07/02/2020 FINDINGS: Lower chest: No acute findings. Hepatobiliary: No mass or other parenchymal abnormality identified. Status post cholecystectomy. No biliary ductal dilatation. Pancreas: No mass, inflammatory changes, or other parenchymal abnormality identified. Spleen:  Within normal limits in size and appearance. Adrenals/Urinary Tract: No masses identified. No evidence of hydronephrosis. Stomach/Bowel: Visualized portions within the abdomen are unremarkable. Vascular/Lymphatic: No pathologically enlarged lymph nodes identified. No abdominal aortic aneurysm demonstrated. Other:  None. Musculoskeletal: No suspicious bone lesions identified. IMPRESSION: 1. Status post cholecystectomy. No biliary ductal dilatation. 2. No MR evidence of pancreatitis or other acute  findings in the abdomen. Electronically Signed   By: Lauralyn Primes M.D.   On: 07/02/2020 15:22   US Abdomen Limited  Result Date: 07/01/2020 CLINICAL DATA:  RIGHT upper quadrant pain. EXAM: ULTRASOUND ABDOMEN LIMITED RIGHT UPPER QUADRANT COMPARISON:  None. FINDINGS: Gallbladder: No gallstones or wall thickening visualized. No sonographic Murphy sign noted by sonographer. Common bile duct: Diameter: 6 mm Liver: No focal lesion identified. Within normal limits in parenchymal echogenicity. Portal vein is patent on color Doppler imaging with normal direction of blood flow towards the liver. Other: None. IMPRESSION: Normal RIGHT upper quadrant ultrasound. Electronically Signed   By: Bary Richard M.D.   On: 07/01/2020 23:08   MR ABDOMEN MRCP W WO CONTAST  Result Date: 07/02/2020 CLINICAL DATA:  Jaundice, acute pancreatitis, nausea, vomiting EXAM: MRI ABDOMEN WITHOUT AND WITH CONTRAST (INCLUDING MRCP) TECHNIQUE: Multiplanar multisequence MR imaging of the abdomen was performed both before and after the administration of intravenous contrast. Heavily T2-weighted images of the biliary and pancreatic ducts were obtained, and three-dimensional MRCP images were rendered by post processing. CONTRAST:  85mL GADAVIST GADOBUTROL 1 MMOL/ML IV SOLN COMPARISON:  CT abdomen pelvis, 07/02/2020 FINDINGS: Lower chest: No acute findings. Hepatobiliary: No mass or other parenchymal abnormality identified. Status post cholecystectomy. No biliary ductal dilatation. Pancreas: No mass, inflammatory changes, or other parenchymal abnormality identified. Spleen:  Within normal limits in size and appearance. Adrenals/Urinary Tract: No masses identified. No evidence of hydronephrosis. Stomach/Bowel: Visualized portions within the abdomen are unremarkable. Vascular/Lymphatic: No pathologically enlarged lymph nodes  identified. No abdominal aortic aneurysm demonstrated. Other:  None. Musculoskeletal: No suspicious bone lesions identified.  IMPRESSION: 1. Status post cholecystectomy. No biliary ductal dilatation. 2. No MR evidence of pancreatitis or other acute findings in the abdomen. Electronically Signed   By: Lauralyn Primes M.D.   On: 07/02/2020 15:22     Labs:   Basic Metabolic Panel: Recent Labs  Lab 07/01/20 1839 07/02/20 0333 07/03/20 0427  NA 131* 130* 135  K 4.5 4.2 3.1*  CL 87* 89* 97*  CO2 27 29 30   GLUCOSE 352* 323* 143*  BUN 21* 22* 11  CREATININE 0.68 0.79 0.67  CALCIUM 9.7 9.3 8.7*  MG  --   --  1.8   GFR CrCl cannot be calculated (Unknown ideal weight.). Liver Function Tests: Recent Labs  Lab 07/01/20 1839 07/02/20 0255 07/02/20 0333 07/03/20 0427  AST 134*  --  99* 56*  ALT 201*  --  169* 107*  ALKPHOS 583*  --  550* 416*  BILITOT 11.5* 5.4* 5.5* 2.6*  PROT 8.2*  --  7.9 6.7  ALBUMIN 3.8  --  3.5 3.0*   Recent Labs  Lab 07/01/20 1839 07/03/20 0427  LIPASE 527* 51   No results for input(s): AMMONIA in the last 168 hours. Coagulation profile Recent Labs  Lab 07/02/20 0255  INR 0.9    CBC: Recent Labs  Lab 07/01/20 1839 07/02/20 0333 07/03/20 0427  WBC 8.9 8.7 6.5  NEUTROABS 6.7  --   --   HGB 16.5 15.2 14.2  HCT 48.4 45.4 42.3  MCV 93.1 93.0 93.0  PLT 242 235 214   Cardiac Enzymes: No results for input(s): CKTOTAL, CKMB, CKMBINDEX, TROPONINI in the last 168 hours. BNP: Invalid input(s): POCBNP CBG: Recent Labs  Lab 07/02/20 2012 07/02/20 2325 07/03/20 0329 07/03/20 0734 07/03/20 1151  GLUCAP 265* 180* 157* 127* 179*   D-Dimer No results for input(s): DDIMER in the last 72 hours. Hgb A1c Recent Labs    07/02/20 0333  HGBA1C 11.5*   Lipid Profile No results for input(s): CHOL, HDL, LDLCALC, TRIG, CHOLHDL, LDLDIRECT in the last 72 hours. Thyroid function studies No results for input(s): TSH, T4TOTAL, T3FREE, THYROIDAB in the last 72 hours.  Invalid input(s): FREET3 Anemia work up No results for input(s): VITAMINB12, FOLATE, FERRITIN, TIBC, IRON,  RETICCTPCT in the last 72 hours. Microbiology Recent Results (from the past 240 hour(s))  Resp Panel by RT-PCR (Flu A&B, Covid) Nasopharyngeal Swab     Status: None   Collection Time: 07/01/20  9:45 PM   Specimen: Nasopharyngeal Swab; Nasopharyngeal(NP) swabs in vial transport medium  Result Value Ref Range Status   SARS Coronavirus 2 by RT PCR NEGATIVE NEGATIVE Final    Comment: (NOTE) SARS-CoV-2 target nucleic acids are NOT DETECTED.  The SARS-CoV-2 RNA is generally detectable in upper respiratory specimens during the acute phase of infection. The lowest concentration of SARS-CoV-2 viral copies this assay can detect is 138 copies/mL. A negative result does not preclude SARS-Cov-2 infection and should not be used as the sole basis for treatment or other patient management decisions. A negative result may occur with  improper specimen collection/handling, submission of specimen other than nasopharyngeal swab, presence of viral mutation(s) within the areas targeted by this assay, and inadequate number of viral copies(<138 copies/mL). A negative result must be combined with clinical observations, patient history, and epidemiological information. The expected result is Negative.  Fact Sheet for Patients:  08/31/20  Fact Sheet for Healthcare Providers:  BloggerCourse.com  This test is no t yet approved or cleared by the [place] and  has been authorized for detection and/or diagnosis of SARS-CoV-2 by FDA under an Emergency Use Authorization (EUA). This EUA will remain  in effect (meaning this test can be used) for the duration of the COVID-19 declaration under Section 564(b)(1) of the Act, 21 U.S.C.section 360bbb-3(b)(1), unless the authorization is terminated  or revoked sooner.       Influenza A by PCR NEGATIVE NEGATIVE Final   Influenza B by PCR NEGATIVE NEGATIVE Final    Comment: (NOTE) The Xpert Xpress  SARS-CoV-2/FLU/RSV plus assay is intended as an aid in the diagnosis of influenza from Nasopharyngeal swab specimens and should not be used as a sole basis for treatment. Nasal washings and aspirates are unacceptable for Xpert Xpress SARS-CoV-2/FLU/RSV testing.  Fact Sheet for Patients: BloggerCourse.com  Fact Sheet for Healthcare Providers: SeriousBroker.it  This test is not yet approved or cleared by the [place] FDA and has been authorized for detection and/or diagnosis of SARS-CoV-2 by FDA under an Emergency Use Authorization (EUA). This EUA will remain in effect (meaning this test can be used) for the duration of the COVID-19 declaration under Section 564(b)(1) of the Act, 21 U.S.C. section 360bbb-3(b)(1), unless the authorization is terminated or revoked.  Performed at [facility], [address], [place], [place] [address]      Discharge Instructions:   Discharge Instructions    Diet - low sodium heart healthy   Complete by: As directed    Diet Carb Modified   Complete by: As directed    Increase activity slowly   Complete by: As directed      Allergies as of 07/03/2020   No Known Allergies     Medication List    TAKE these medications   Descovy 200-25 MG tablet Generic drug: emtricitabine-tenofovir AF TAKE 1 TABLET BY MOUTH DAILY. What changed: Another medication with the same name was removed. Continue taking this medication, and follow the directions you see here.   lisinopril 10 MG tablet Commonly known as: ZESTRIL Take 1 tablet (10 mg total) by mouth daily.   metFORMIN 850 MG tablet Commonly known as: Glucophage Take 1 tablet (850 mg total) by mouth 2 (two) times daily with a meal. What changed:   medication strength  how much to take         Time coordinating discharge: 27 minutes  Signed:  Roswell Ndiaye  [facility] 07/03/2020, 1:54 PM   Pager on  [email or web address]. If 7PM-7AM, please contact night-coverage at [email or web address]

## 2020-07-11 ENCOUNTER — Other Ambulatory Visit (HOSPITAL_COMMUNITY): Payer: Self-pay

## 2020-07-27 ENCOUNTER — Other Ambulatory Visit: Payer: Self-pay

## 2020-07-27 ENCOUNTER — Ambulatory Visit (INDEPENDENT_AMBULATORY_CARE_PROVIDER_SITE_OTHER): Payer: Self-pay | Admitting: Infectious Diseases

## 2020-07-27 ENCOUNTER — Other Ambulatory Visit (HOSPITAL_COMMUNITY): Payer: Self-pay

## 2020-07-27 ENCOUNTER — Other Ambulatory Visit: Payer: Self-pay | Admitting: Infectious Diseases

## 2020-07-27 VITALS — BP 139/98 | HR 75 | Temp 98.5°F

## 2020-07-27 DIAGNOSIS — Z7189 Other specified counseling: Secondary | ICD-10-CM

## 2020-07-27 DIAGNOSIS — Z7252 High risk homosexual behavior: Secondary | ICD-10-CM

## 2020-07-27 DIAGNOSIS — Z5181 Encounter for therapeutic drug level monitoring: Secondary | ICD-10-CM

## 2020-07-27 MED ORDER — EMTRICITABINE-TENOFOVIR AF 200-25 MG PO TABS
1.0000 | ORAL_TABLET | Freq: Every day | ORAL | 2 refills | Status: DC
  Filled 2020-07-27 – 2020-08-23 (×3): qty 30, 30d supply, fill #0

## 2020-07-27 NOTE — Patient Instructions (Addendum)
Nice to see you today   Please stop by the lab on your way out.   I am going to also screen you for hepatitis c to make sure you don't need any treatment for that.   Bring up your last few pay stubs to Lupita Leash our pharmacy tech so we can re-process your Descovy. Once we have that we can complete the application and get you back on it right away.   Please return in 3 months to check in again.

## 2020-07-27 NOTE — Assessment & Plan Note (Signed)
HIV Prevention, on PrEP = patient reports good adherence and tolerability to prescribed prophylaxis with Descovy once daily. He is taking it correctly and there is no concern for intolerability, however has not received for a month.  It appears we have been trying to contact him as his Laroy Apple advancing access has expired.  Lupita Leash came in to speak with him from our pharmacy department today to collect information so we can refill this.  Medication will little as long as previously prescribed.

## 2020-07-27 NOTE — Assessment & Plan Note (Signed)
Medication Monitoring = history of diabetes.  We will check serum creatinine today for therapeutic monitoring while on cidofovir therapy.

## 2020-07-27 NOTE — Progress Notes (Signed)
Subjective   Insured   []    Uninsured  [x]    Demographics Name: Lawrence Mccormick DOB: Aug 27, 1980  MRN: [id number] Cox  Race:  Black or African American [2] Marital Status:  Married   HPI  Lawrence Mccormick is a 40 y.o. male here for follow up on prep with her partner, whom is HIV + and well controlled on medication in care here at [facility].   He has not had access to his medication for a month now and is not sure why. When he was taking this he had no side effects or problems taking it and did not miss doses.   He has one male partner only, long term.    Review of Systems  Constitutional:  Negative for chills and fever.  HENT:  Negative for sore throat.        No dental problems  Respiratory:  Negative for cough.   Cardiovascular:  Negative for chest pain and leg swelling.  Gastrointestinal:  Negative for abdominal pain, diarrhea and vomiting.  Genitourinary:  Negative for dysuria and flank pain.  Musculoskeletal:  Negative for myalgias and neck pain.  Skin:  Negative for rash.  Neurological:  Negative for dizziness and headaches.  Psychiatric/Behavioral:  The patient is not nervous/anxious.   All other systems reviewed and are negative.   No Known Allergies  Past Medical History:  Diagnosis Date   Diabetes mellitus without complication (HCC)     Social History   Substance and Sexual Activity  Sexual Activity Not on file    Social History   Tobacco Use  Smoking Status Every Day   Packs/day: 0.25   Years: 15.00   Pack years: 3.75   Types: Cigarettes  Smokeless Tobacco Not on file   Social History   Substance and Sexual Activity  Alcohol Use Yes   Comment: occ    Drug use?   Yes []  No [x]   Injectable drug use?   Yes []     No [x]   Missing doses? Yes []    No  [x]   Outpatient Encounter Medications as of 07/27/2020  Medication Sig   [DISCONTINUED] emtricitabine-tenofovir AF (DESCOVY) 200-25 MG tablet TAKE 1 TABLET BY MOUTH DAILY.   emtricitabine-tenofovir  AF (DESCOVY) 200-25 MG tablet Take 1 tablet by mouth daily.   lisinopril (ZESTRIL) 10 MG tablet Take 1 tablet (10 mg total) by mouth daily. (Patient not taking: Reported on 07/27/2020)   metFORMIN (GLUCOPHAGE) 850 MG tablet Take 1 tablet (850 mg total) by mouth 2 (two) times daily with a meal. (Patient not taking: Reported on 07/27/2020)   No facility-administered encounter medications on file as of 07/27/2020.    No flowsheet data found.     Objective   Vitals:   07/27/20 0903  BP: (!) 139/98  Pulse: 75  Temp: 98.5 F (36.9 C)  SpO2: 98%    There is no height or weight on file to calculate BMI.  Physical Exam Vitals reviewed.  Constitutional:      Appearance: He is well-developed.     Comments: Seated comfortably in chair during visit.   HENT:     Mouth/Throat:     Dentition: Normal dentition. No dental abscesses.  Cardiovascular:     Rate and Rhythm: Normal rate and regular rhythm.     Heart sounds: Normal heart sounds.  Pulmonary:     Effort: Pulmonary effort is normal.     Breath sounds: Normal breath sounds.  Abdominal:     General: There  is no distension.     Palpations: Abdomen is soft.     Tenderness: There is no abdominal tenderness.  Lymphadenopathy:     Cervical: No cervical adenopathy.  Skin:    General: Skin is warm and dry.     Findings: No rash.  Neurological:     Mental Status: He is alert and oriented to person, place, and time.  Psychiatric:        Judgment: Judgment normal.     Comments: In good spirits today and engaged in care discussion.       Labs: Creatinine Lab Results  Component Value Date   CREATININE 0.67 07/03/2020   CREATININE 0.79 07/02/2020   CREATININE 0.68 07/01/2020    HIV Lab Results  Component Value Date   HIV Non Reactive 07/02/2020   HIV NON-REACTIVE 01/05/2020   HIV NON-REACTIVE 06/07/2019    GFR CrCl cannot be calculated (Patient's most recent lab result is older than the maximum 21 days  allowed.).  Hepatitis B Lab Results  Component Value Date   HEPBSAB NON-REACTIVE 06/07/2019   Lab Results  Component Value Date   HEPBSAG NON REACTIVE 07/02/2020    Hepatitis C Lab Results  Component Value Date   HCVAB NON REACTIVE 07/02/2020    Hepatitis A Lab Results  Component Value Date   HAV REACTIVE (A) 06/07/2019    RPR and STI Lab Results  Component Value Date   LABRPR NON-REACTIVE 01/05/2020   LABRPR NON-REACTIVE 06/07/2019    STI Results GC GC CT CT  Latest Ref Rng & Units - NEGATIVE - NEGATIVE  01/05/2020 Negative - Negative -  06/07/2019 Negative - Negative -  09/05/2013 - NEGATIVE - POSITIVE(A)     Assessment & Plan:    Patient Active Problem List   Diagnosis Date Noted   HIV Prevention  07/27/2020   Medication monitoring encounter 07/27/2020   Jaundice 07/02/2020   Pancreatitis 07/02/2020   Uncontrolled diabetes mellitus with hyperglycemia (HCC) 07/02/2020   Hyperglycemia 08/14/2013   Major depressive disorder, recurrent episode, severe, without mention of psychotic behavior 08/13/2013   Suicidal ideations 08/13/2013   Severe recurrent major depression without psychotic features (HCC) 08/13/2013    Problem List Items Addressed This Visit       Unprioritized   Medication monitoring encounter    Medication Monitoring = history of diabetes.  We will check serum creatinine today for therapeutic monitoring while on cidofovir therapy.       HIV Prevention  - Primary    HIV Prevention, on PrEP = patient reports good adherence and tolerability to prescribed prophylaxis with Descovy once daily. He is taking it correctly and there is no concern for intolerability, however has not received for a month.  It appears we have been trying to contact him as his Laroy Apple advancing access has expired.  Lupita Leash came in to speak with him from our pharmacy department today to collect information so we can refill this.  Medication will little as long as previously  prescribed.       Other Visit Diagnoses     High risk homosexual behavior       Relevant Medications   emtricitabine-tenofovir AF (DESCOVY) 200-25 MG tablet   Other Relevant Orders   Creatinine, serum   RPR   HIV Antibody (routine testing w rflx)       Rexene Alberts, MSN, NP-C [facility]  [email or web address] Pager: [phone] Office: [phone] [facility] Main Line: [phone]

## 2020-07-27 NOTE — Progress Notes (Unsigned)
Subjective   Insured   []    Uninsured  []    Demographics Name: Lawrence Mccormick DOB: Oct 21, 1980  MRN: [id number] Cox  Race:  Black or African American [2] Marital Status:  Married   HPI  Lawrence Mccormick is a 40 y.o. male ***   Review of Systems   No Known Allergies  Past Medical History:  Diagnosis Date   Diabetes mellitus without complication (HCC)     Social History   Substance and Sexual Activity  Sexual Activity Not on file    Social History   Tobacco Use  Smoking Status Every Day   Packs/day: 0.25   Years: 15.00   Pack years: 3.75   Types: Cigarettes  Smokeless Tobacco Not on file   Social History   Substance and Sexual Activity  Alcohol Use Yes   Comment: occ    Drug use?   Yes []  No []   Injectable drug use?   Yes []     No []   Missing doses? Yes []    No  []   Outpatient Encounter Medications as of 07/27/2020  Medication Sig   emtricitabine-tenofovir AF (DESCOVY) 200-25 MG tablet TAKE 1 TABLET BY MOUTH DAILY.   lisinopril (ZESTRIL) 10 MG tablet Take 1 tablet (10 mg total) by mouth daily. (Patient not taking: Reported on 07/27/2020)   metFORMIN (GLUCOPHAGE) 850 MG tablet Take 1 tablet (850 mg total) by mouth 2 (two) times daily with a meal. (Patient not taking: Reported on 07/27/2020)   No facility-administered encounter medications on file as of 07/27/2020.    No flowsheet data found.     Objective   There were no vitals filed for this visit.  There is no height or weight on file to calculate BMI.  Physical Exam    Labs: Creatinine Lab Results  Component Value Date   CREATININE 0.67 07/03/2020   CREATININE 0.79 07/02/2020   CREATININE 0.68 07/01/2020    HIV Lab Results  Component Value Date   HIV Non Reactive 07/02/2020   HIV NON-REACTIVE 01/05/2020   HIV NON-REACTIVE 06/07/2019    GFR CrCl cannot be calculated (Patient's most recent lab result is older than the maximum 21 days allowed.).  Hepatitis B Lab Results   Component Value Date   HEPBSAB NON-REACTIVE 06/07/2019   Lab Results  Component Value Date   HEPBSAG NON REACTIVE 07/02/2020    Hepatitis C Lab Results  Component Value Date   HCVAB NON REACTIVE 07/02/2020    Hepatitis A Lab Results  Component Value Date   HAV REACTIVE (A) 06/07/2019    RPR and STI Lab Results  Component Value Date   LABRPR NON-REACTIVE 01/05/2020   LABRPR NON-REACTIVE 06/07/2019    STI Results GC GC CT CT  Latest Ref Rng & Units - NEGATIVE - NEGATIVE  01/05/2020 Negative - Negative -  06/07/2019 Negative - Negative -  09/05/2013 - NEGATIVE - POSITIVE(A)     Assessment & Plan:    Patient Active Problem List   Diagnosis Date Noted   Jaundice 07/02/2020   Pancreatitis 07/02/2020   Uncontrolled diabetes mellitus with hyperglycemia (HCC) 07/02/2020   Hyperglycemia 08/14/2013   Major depressive disorder, recurrent episode, severe, without mention of psychotic behavior 08/13/2013   Suicidal ideations 08/13/2013   Severe recurrent major depression without psychotic features (HCC) 08/13/2013    Problem List Items Addressed This Visit   None   HIV Prevention, on PrEP = patient reports good adherence and tolerability to prescribed prophylaxis with ***. {He;  She} is taking it correctly and there is no concern for intolerability. No concern for acute retroviral syndrome since last office visit. Will check HIV Ab and continue patient's prescription if non-reactive. New partners warranting STI screening based off sexual preference. RPR will be assessed as well. No symptoms for STIs today.   Medication Monitoring = serum creatinines have been stable on medications. Will continue on Q55m interval.    Rexene Alberts, MSN, NP-C [facility]  [email or web address] Pager: [phone] Office: [phone] [facility] Main Line: [phone]

## 2020-07-28 ENCOUNTER — Telehealth: Payer: Self-pay

## 2020-07-28 LAB — HIV ANTIBODY (ROUTINE TESTING W REFLEX): HIV 1&2 Ab, 4th Generation: NONREACTIVE

## 2020-07-28 LAB — RPR: RPR Ser Ql: NONREACTIVE

## 2020-07-28 NOTE — Telephone Encounter (Signed)
Relayed negative results (HIV, RPR and HCV) to patient. No further questions/concerns at this time.  Murle Otting Loyola Mast, RN

## 2020-07-28 NOTE — Telephone Encounter (Signed)
-----   Message from Blanchard Kelch, NP sent at 07/28/2020  1:11 PM EDT ----- Please call Lawrence Mccormick to let him know that all of his blood tests were negative for any HIV and negative for syphilis.  He also does not have hepatitis C.

## 2020-08-23 ENCOUNTER — Other Ambulatory Visit (HOSPITAL_COMMUNITY): Payer: Self-pay

## 2020-09-11 ENCOUNTER — Other Ambulatory Visit (HOSPITAL_COMMUNITY): Payer: Self-pay

## 2020-09-12 ENCOUNTER — Other Ambulatory Visit (HOSPITAL_COMMUNITY): Payer: Self-pay

## 2020-10-27 ENCOUNTER — Ambulatory Visit: Payer: Self-pay | Admitting: Infectious Diseases

## 2020-11-06 ENCOUNTER — Other Ambulatory Visit (HOSPITAL_COMMUNITY): Payer: Self-pay

## 2022-07-28 ENCOUNTER — Other Ambulatory Visit: Payer: Self-pay

## 2022-07-28 ENCOUNTER — Emergency Department (HOSPITAL_COMMUNITY)
Admission: EM | Admit: 2022-07-28 | Discharge: 2022-07-28 | Disposition: A | Discharge status: Home / Self Care | Payer: Self-pay | Attending: Emergency Medicine | Admitting: Emergency Medicine

## 2022-07-28 DIAGNOSIS — R112 Nausea with vomiting, unspecified: Secondary | ICD-10-CM | POA: Insufficient documentation

## 2022-07-28 DIAGNOSIS — E119 Type 2 diabetes mellitus without complications: Secondary | ICD-10-CM | POA: Insufficient documentation

## 2022-07-28 DIAGNOSIS — R197 Diarrhea, unspecified: Secondary | ICD-10-CM | POA: Insufficient documentation

## 2022-07-28 DIAGNOSIS — R1084 Generalized abdominal pain: Secondary | ICD-10-CM | POA: Insufficient documentation

## 2022-07-28 MED ORDER — ONDANSETRON 4 MG PO TBDP
4.0000 mg | ORAL_TABLET | Freq: Once | ORAL | Status: AC
  Administered 2022-07-28: 4 mg via ORAL
  Filled 2022-07-28: qty 1

## 2022-07-28 MED ORDER — ONDANSETRON HCL 4 MG PO TABS: 4.0000 mg | ORAL_TABLET | Freq: Three times a day (TID) | ORAL | 0 refills | Status: AC | PRN

## 2022-07-28 MED ORDER — DICYCLOMINE HCL 20 MG PO TABS: 20.0000 mg | ORAL_TABLET | Freq: Two times a day (BID) | ORAL | 0 refills | Status: DC | PRN

## 2022-07-28 MED ORDER — DICYCLOMINE HCL 10 MG PO CAPS
10.0000 mg | ORAL_CAPSULE | Freq: Once | ORAL | Status: AC
  Administered 2022-07-28: 10 mg via ORAL
  Filled 2022-07-28: qty 1

## 2022-07-28 NOTE — ED Triage Notes (Signed)
Pt arrived via POV. C/o of episode of N/V and abd pain yesterday afternoon that lasted 2 hrs. Reports only mild abd pain now.  Concerned they had food poisoning.  Aox4

## 2022-07-28 NOTE — ED Provider Notes (Signed)
[facility] EMERGENCY DEPARTMENT AT [facility] Provider Note   CSN: [id number] Arrival date & time: 07/28/22  1718     History {Add pertinent medical, surgical, social history, OB history to HPI:1} Chief Complaint  Patient presents with   Abdominal Pain   Emesis   Nausea    Lawrence Mccormick is a 42 y.o. male      Home Medications Prior to Admission medications   Medication Sig Start Date End Date Taking? Authorizing Provider  dicyclomine (BENTYL) 20 MG tablet Take 1 tablet (20 mg total) by mouth 2 (two) times daily as needed for up to 3 days for spasms. 07/28/22 07/31/22 Yes Lorette Peterkin L, PA-C  ondansetron (ZOFRAN) 4 MG tablet Take 1 tablet (4 mg total) by mouth every 8 (eight) hours as needed for up to 3 days for nausea or vomiting. 07/28/22 07/31/22 Yes Reginaldo Hazard L, PA-C  emtricitabine-tenofovir AF (DESCOVY) 200-25 MG tablet Take 1 tablet by mouth daily. 07/27/20   Blanchard Kelch, NP  lisinopril (ZESTRIL) 10 MG tablet Take 1 tablet (10 mg total) by mouth daily. Patient not taking: Reported on 07/27/2020 07/03/20 07/03/21  Lurene Shadow, MD  metFORMIN (GLUCOPHAGE) 850 MG tablet Take 1 tablet (850 mg total) by mouth 2 (two) times daily with a meal. Patient not taking: Reported on 07/27/2020 07/03/20 07/03/21  Lurene Shadow, MD      Allergies    Patient has no known allergies.    Review of Systems   Review of Systems  Physical Exam Updated Vital Signs BP (!) 125/92 (BP Location: Left Arm)   Pulse 78   Temp 98.5 F (36.9 C) (Oral)   Resp 17   Ht 5\' 9"  (1.753 m)   Wt 65.8 kg   SpO2 99%   BMI 21.41 kg/m  Physical Exam  ED Results / Procedures / Treatments   Labs (all labs ordered are listed, but only abnormal results are displayed) Labs Reviewed - No data to display  EKG None  Radiology No results found.  Procedures Procedures  {Document cardiac monitor, telemetry assessment procedure when appropriate:1}  Medications Ordered in ED Medications   ondansetron (ZOFRAN-ODT) disintegrating tablet 4 mg (has no administration in time range)  dicyclomine (BENTYL) capsule 10 mg (has no administration in time range)    ED Course/ Medical Decision Making/ A&P   {   Click here for ABCD2, HEART and other calculatorsREFRESH Note before signing :1}                          Medical Decision Making Risk Prescription drug management.   ***  {Document critical care time when appropriate:1} {Document review of labs and clinical decision tools ie heart score, Chads2Vasc2 etc:1}  {Document your independent review of radiology images, and any outside records:1} {Document your discussion with family members, caretakers, and with consultants:1} {Document social determinants of health affecting pt's care:1} {Document your decision making why or why not admission, treatments were needed:1} Final Clinical Impression(s) / ED Diagnoses Final diagnoses:  Generalized abdominal pain  Nausea and vomiting, unspecified vomiting type    Rx / DC Orders ED Discharge Orders          Ordered    ondansetron (ZOFRAN) 4 MG tablet  Every 8 hours PRN        07/28/22 1744    dicyclomine (BENTYL) 20 MG tablet  2 times daily PRN        07/28/22 1744

## 2022-07-28 NOTE — Discharge Instructions (Signed)
Thank you for letting us take care of you today.  We gave you a medication to help with nausea and abdominal pain. As discussed, we are unable to tell you specifically if you are dealing with food poisoning and that is the cause of your symptoms yesterday. We typically treat the symptoms of suspected food poisoning so I am sending you home with medication to help with nausea and abdominal cramping as needed.  Follow-up with PCP for any continued symptoms or concerns. For new or worsening symptoms including uncontrolled vomiting, blood in vomit or stool, fever, chest pain, shortness of breath, or other new, concerning symptoms, return to nearest ED for re-evaluation.

## 2023-06-05 ENCOUNTER — Ambulatory Visit (INDEPENDENT_AMBULATORY_CARE_PROVIDER_SITE_OTHER)

## 2023-06-05 ENCOUNTER — Encounter (HOSPITAL_COMMUNITY): Payer: Self-pay

## 2023-06-05 ENCOUNTER — Ambulatory Visit (HOSPITAL_COMMUNITY)
Admission: EM | Admit: 2023-06-05 | Discharge: 2023-06-05 | Disposition: A | Discharge status: Home / Self Care | Attending: Emergency Medicine | Admitting: Emergency Medicine

## 2023-06-05 DIAGNOSIS — M79605 Pain in left leg: Secondary | ICD-10-CM

## 2023-06-05 DIAGNOSIS — S81802A Unspecified open wound, left lower leg, initial encounter: Secondary | ICD-10-CM

## 2023-06-05 DIAGNOSIS — E1165 Type 2 diabetes mellitus with hyperglycemia: Secondary | ICD-10-CM

## 2023-06-05 LAB — POCT FASTING CBG KUC MANUAL ENTRY: POCT Glucose (KUC): 362 mg/dL — AB (ref 70–99)

## 2023-06-05 MED ORDER — METFORMIN HCL 500 MG PO TABS: 500.0000 mg | ORAL_TABLET | Freq: Two times a day (BID) | ORAL | 2 refills | Status: DC

## 2023-06-05 MED ORDER — DOXYCYCLINE HYCLATE 100 MG PO CAPS: 100.0000 mg | ORAL_CAPSULE | Freq: Two times a day (BID) | ORAL | 0 refills | Status: DC

## 2023-06-05 MED ORDER — ACETAMINOPHEN 325 MG PO TABS
ORAL_TABLET | ORAL | Status: AC
  Filled 2023-06-05: qty 3

## 2023-06-05 MED ORDER — ACETAMINOPHEN 325 MG PO TABS
975.0000 mg | ORAL_TABLET | Freq: Once | ORAL | Status: AC
  Administered 2023-06-05: 975 mg via ORAL

## 2023-06-05 NOTE — ED Provider Notes (Signed)
 [facility]    CSN: [id number] Arrival date & time: 06/05/23  1348      History   Chief Complaint Chief Complaint  Patient presents with   Leg Pain   Leg Swelling    HPI Lawrence Mccormick is a 43 y.o. male.  Here with left leg pain, swelling x 1 week He has a chronic wound in this area Wound is warm and tender to touch. No drainage.  Denies any falls, trauma, injury  Has not attempted intervention yet Not having shortness of breath, chest pain, cough. No fevers reported   History of T2 diabetes. Has not taken medications in many months.  Past Medical History:  Diagnosis Date   Diabetes mellitus without complication [facility])     Patient Active Problem List   Diagnosis Date Noted   HIV Prevention  07/27/2020   Medication monitoring encounter 07/27/2020   Jaundice 07/02/2020   Pancreatitis 07/02/2020   Uncontrolled diabetes mellitus with hyperglycemia (HCC) 07/02/2020   Hyperglycemia 08/14/2013   Severe episode of recurrent major depressive disorder (HCC) 08/13/2013   Suicidal ideations 08/13/2013   Severe recurrent major depression without psychotic features (HCC) 08/13/2013    History reviewed. No pertinent surgical history.     Home Medications    Prior to Admission medications   Medication Sig Start Date End Date Taking? Authorizing Provider  doxycycline (VIBRAMYCIN) 100 MG capsule Take 1 capsule (100 mg total) by mouth 2 (two) times daily for 7 days. 06/05/23 06/12/23 Yes Marwah Disbro, Ivette Marks, PA-C  metFORMIN  (GLUCOPHAGE ) 500 MG tablet Take 1 tablet (500 mg total) by mouth 2 (two) times daily with a meal. 06/05/23  Yes Jaaziah Schulke, Ivette Marks, PA-C  dicyclomine  (BENTYL ) 20 MG tablet Take 1 tablet (20 mg total) by mouth 2 (two) times daily as needed for up to 3 days for spasms. 07/28/22 07/31/22  Gowens, Mariah L, PA-C  emtricitabine -tenofovir  AF (DESCOVY ) 200-25 MG tablet Take 1 tablet by mouth daily. 07/27/20   Orson Blalock, NP  lisinopril  (ZESTRIL ) 10 MG tablet  Take 1 tablet (10 mg total) by mouth daily. Patient not taking: Reported on 07/27/2020 07/03/20 07/03/21  Sheril Dines, MD    Family History History reviewed. No pertinent family history.  Social History Social History   Tobacco Use   Smoking status: Every Day    Current packs/day: 0.25    Average packs/day: 0.3 packs/day for 15.0 years (3.8 ttl pk-yrs)    Types: Cigarettes  Vaping Use   Vaping status: Former   Substances: Nicotine, Flavoring  Substance Use Topics   Alcohol use: Yes    Comment: occ   Drug use: No     Allergies   Patient has no known allergies.   Review of Systems Review of Systems Per HPI  Physical Exam Triage Vital Signs ED Triage Vitals [06/05/23 1435]  Encounter Vitals Group     BP      Systolic BP Percentile      Diastolic BP Percentile      Pulse      Resp      Temp      Temp src      SpO2      Weight      Height      Head Circumference      Peak Flow      Pain Score 9     Pain Loc      Pain Education      Exclude from Growth Chart  No data found.  Updated Vital Signs BP 119/79 (BP Location: Right Arm)   Pulse 81   Temp 99.4 F (37.4 C) (Oral)   Resp 16   SpO2 96%    Physical Exam Vitals and nursing note reviewed.  Constitutional:      General: He is not in acute distress.    Appearance: He is not ill-appearing.  HENT:     Mouth/Throat:     Pharynx: Oropharynx is clear.  Cardiovascular:     Rate and Rhythm: Normal rate and regular rhythm.     Pulses: Normal pulses.     Heart sounds: Normal heart sounds.  Pulmonary:     Effort: Pulmonary effort is normal.     Breath sounds: Normal breath sounds.  Musculoskeletal:     Comments: Full ROM at knee and ankle  Skin:    General: Skin is warm and dry.     Capillary Refill: Capillary refill takes less than 2 seconds.     Findings: Wound (chronic) present.          Comments: Left lower leg with chronic wound. There is swelling and tenderness around wound. Swelling  extends distally to the ankle along with erythema and warmth. There is no swelling or tenderness of calf  Neurological:     Mental Status: He is alert and oriented to person, place, and time.     Gait: Gait abnormal (antalgic).     UC Treatments / Results  Labs (all labs ordered are listed, but only abnormal results are displayed) Labs Reviewed  POCT FASTING CBG [facility] MANUAL ENTRY - Abnormal; Notable for the following components:      Result Value   POCT Glucose ([facility]) 362 (*)    All other components within normal limits    EKG  Radiology DG Tibia/Fibula Left Result Date: 06/05/2023 CLINICAL DATA:  chronic non healing wound, tenderness with erythema and swelling x 1 week EXAM: LEFT TIBIA AND FIBULA - 2 VIEW COMPARISON:  None Available. FINDINGS: No acute fracture or dislocation. There is no evidence of arthropathy or other focal bone abnormality. Subcutaneous edema. IMPRESSION: No acute fracture or dislocation.  No radiopaque foreign body. Electronically Signed   By: Rance Burrows M.D.   On: 06/05/2023 16:04    Procedures Procedures   Medications Ordered in UC Medications  acetaminophen  (TYLENOL ) tablet 975 mg (975 mg Oral Given 06/05/23 1521)    Initial Impression / Assessment and Plan / UC Course  I have reviewed the triage vital signs and the nursing notes.  Pertinent labs & imaging results that were available during my care of the patient were reviewed by me and considered in my medical decision making (see chart for details).  Temp on arrival ranging from 99 - 100.2 Tylenol  dose is given in clinic CBG 362  Left lower leg xray without signs of osteomyelitis. Images independently reviewed by me, agree with radiology interpretation.  Cover for cellulitis with doxycycline BID x 7 days. Advised if after 3 full days of the medicine he is having worsening swelling/pain or continued fever, to please be evaluated in the emergency department. Discussed uncontrolled diabetes will  play a large role in healing. He is very concerned about losing his leg and repeatedly asks staff to "not take it off". I have placed a referral to wound care and the diabetic educator We will restart his metformin  as well - 500 mg twice daily with meals. Staff has made him a primary care appointment for July 28th  Strict ED precautions  Patient agrees to plan, no questions at this time   Final Clinical Impressions(s) / UC Diagnoses   Final diagnoses:  Pain of left lower extremity  Uncontrolled type 2 diabetes mellitus with hyperglycemia (HCC)  Non-healing wound of lower extremity, left, initial encounter     Discharge Instructions      Please take the antibiotic to treat for leg wound infection. Doxycycline twice daily for 7 days in a row. Please take medication as prescribed.  Make sure you finish all of the pills, you should not have any leftover. I have placed a referral to the diabetic educator.  They will contact you to discuss diabetes management and the importance of controlling her blood sugars. We have also made you a primary care appointment for July 28.  It's very important you keep this appointment. I am also restarting you on your metformin .  Please take 1 tablet in the morning and 1 tablet at night every single day with meals.  The wound care center will also contact you to make an appointment  If you develop worsening pain, swelling, or high fever, please go directly to the emergency department.   ED Prescriptions     Medication Sig Dispense Auth. Provider   metFORMIN  (GLUCOPHAGE ) 500 MG tablet Take 1 tablet (500 mg total) by mouth 2 (two) times daily with a meal. 30 tablet Diamantina Edinger, PA-C   doxycycline (VIBRAMYCIN) 100 MG capsule Take 1 capsule (100 mg total) by mouth 2 (two) times daily for 7 days. 14 capsule Daronte Shostak, Ivette Marks, PA-C      PDMP not reviewed this encounter.   Creighton Doffing, [place] 06/05/23 1656

## 2023-06-05 NOTE — Discharge Instructions (Addendum)
 Please take the antibiotic to treat for leg wound infection. Doxycycline twice daily for 7 days in a row. Please take medication as prescribed.  Make sure you finish all of the pills, you should not have any leftover. I have placed a referral to the diabetic educator.  They will contact you to discuss diabetes management and the importance of controlling her blood sugars. We have also made you a primary care appointment for July 28.  It's very important you keep this appointment. I am also restarting you on your metformin .  Please take 1 tablet in the morning and 1 tablet at night every single day with meals.  The wound care center will also contact you to make an appointment  If you develop worsening pain, swelling, or high fever, please go directly to the emergency department.

## 2023-06-05 NOTE — ED Triage Notes (Signed)
 Patient presenting with left leg swelling and pain on the lower calf starting from a wound on the anterior calf onset 1 week ago. Wound on the lower left leg is chronic. No known falls or injuries.  Prescriptions or OTC medications tried: No

## 2023-06-06 ENCOUNTER — Ambulatory Visit: Payer: Self-pay

## 2023-06-06 NOTE — Telephone Encounter (Signed)
 Copied from CRM [id number]. Topic: Clinical - Red Word Triage >> Jun 06, 2023  8:18 AM Lawrence Mccormick wrote: Red Word that prompted transfer to Nurse Triage: Patient is calling to sharp pain in his foot. Went to UC yesterday. Looking for the mobile unit so that he can get is medicine.   Chief Complaint: follow-up  Additional Notes: Patient and mother asking for mobile unit details in order to pick up medications. RN advised that his medications from the 5/8 UC visit were sent to his pharmacy on file. RN also reviewed DC instructions from the visit and confirmed NP appt on 7/28.   Reason for Disposition  Health Information question, no triage required and triager able to answer question  Answer Assessment - Initial Assessment Questions 1. REASON FOR CALL or QUESTION: "What is your reason for calling today?" or "How can I best help you?" or "What question do you have that I can help answer?"     Patient asking for clarity on DC instruction  Protocols used: Information Only Call - No Triage-A-AH

## 2023-06-07 ENCOUNTER — Other Ambulatory Visit: Payer: Self-pay

## 2023-06-07 ENCOUNTER — Encounter (HOSPITAL_COMMUNITY): Payer: Self-pay | Admitting: *Deleted

## 2023-06-07 ENCOUNTER — Emergency Department (HOSPITAL_COMMUNITY)

## 2023-06-07 ENCOUNTER — Inpatient Hospital Stay (HOSPITAL_COMMUNITY)
Admission: EM | Admit: 2023-06-07 | Discharge: 2023-06-09 | DRG: 603 | Disposition: A | Discharge status: Home / Self Care | Attending: Internal Medicine | Admitting: Internal Medicine

## 2023-06-07 DIAGNOSIS — E1165 Type 2 diabetes mellitus with hyperglycemia: Secondary | ICD-10-CM | POA: Diagnosis present

## 2023-06-07 DIAGNOSIS — L02416 Cutaneous abscess of left lower limb: Secondary | ICD-10-CM | POA: Diagnosis present

## 2023-06-07 DIAGNOSIS — Z560 Unemployment, unspecified: Secondary | ICD-10-CM

## 2023-06-07 DIAGNOSIS — Z7984 Long term (current) use of oral hypoglycemic drugs: Secondary | ICD-10-CM

## 2023-06-07 DIAGNOSIS — M7989 Other specified soft tissue disorders: Principal | ICD-10-CM

## 2023-06-07 DIAGNOSIS — L03116 Cellulitis of left lower limb: Secondary | ICD-10-CM | POA: Diagnosis not present

## 2023-06-07 DIAGNOSIS — Z79899 Other long term (current) drug therapy: Secondary | ICD-10-CM

## 2023-06-07 DIAGNOSIS — M79662 Pain in left lower leg: Principal | ICD-10-CM

## 2023-06-07 DIAGNOSIS — Z794 Long term (current) use of insulin: Secondary | ICD-10-CM

## 2023-06-07 DIAGNOSIS — Z87891 Personal history of nicotine dependence: Secondary | ICD-10-CM

## 2023-06-07 LAB — BASIC METABOLIC PANEL WITH GFR
Anion gap: 11 (ref 5–15)
BUN: 12 mg/dL (ref 6–20)
CO2: 27 mmol/L (ref 22–32)
Calcium: 9.3 mg/dL (ref 8.9–10.3)
Chloride: 95 mmol/L — ABNORMAL LOW (ref 98–111)
Creatinine, Ser: 1.03 mg/dL (ref 0.61–1.24)
GFR, Estimated: >60 mL/min (ref >60)
Glucose, Bld: 430 mg/dL — ABNORMAL HIGH (ref 70–99)
Potassium: 3.9 mmol/L (ref 3.5–5.1)
Sodium: 133 mmol/L — ABNORMAL LOW (ref 135–145)

## 2023-06-07 LAB — CBC WITH DIFFERENTIAL/PLATELET
Abs Immature Granulocytes: 0.03 10*3/uL (ref 0.00–0.07)
Basophils Absolute: 0 10*3/uL (ref 0.0–0.1)
Basophils Relative: 0 %
Eosinophils Absolute: 0.1 10*3/uL (ref 0.0–0.5)
Eosinophils Relative: 1 %
HCT: 38.8 % — ABNORMAL LOW (ref 39.0–52.0)
Hemoglobin: 13.6 g/dL (ref 13.0–17.0)
Immature Granulocytes: 0 %
Lymphocytes Relative: 20 %
Lymphs Abs: 1.7 10*3/uL (ref 0.7–4.0)
MCH: 30.8 pg (ref 26.0–34.0)
MCHC: 35.1 g/dL (ref 30.0–36.0)
MCV: 87.8 fL (ref 80.0–100.0)
Monocytes Absolute: 1.2 10*3/uL — ABNORMAL HIGH (ref 0.1–1.0)
Monocytes Relative: 14 %
Neutro Abs: 5.5 10*3/uL (ref 1.7–7.7)
Neutrophils Relative %: 65 %
Platelets: 236 10*3/uL (ref 150–400)
RBC: 4.42 MIL/uL (ref 4.22–5.81)
RDW: 11.9 % (ref 11.5–15.5)
WBC: 8.6 10*3/uL (ref 4.0–10.5)
nRBC: 0 % (ref 0.0–0.2)

## 2023-06-07 LAB — RAPID HIV SCREEN (HIV 1/2 AB+AG)
HIV 1/2 Antibodies: NONREACTIVE
HIV-1 P24 Antigen - HIV24: NONREACTIVE

## 2023-06-07 LAB — GLUCOSE, CAPILLARY
Glucose-Capillary: 310 mg/dL — ABNORMAL HIGH (ref 70–99)
Glucose-Capillary: 188 mg/dL — ABNORMAL HIGH (ref 70–99)

## 2023-06-07 LAB — SEDIMENTATION RATE: Sed Rate: 42 mm/h — ABNORMAL HIGH (ref 0–16)

## 2023-06-07 LAB — CBG MONITORING, ED: Glucose-Capillary: 405 mg/dL — ABNORMAL HIGH (ref 70–99)

## 2023-06-07 LAB — C-REACTIVE PROTEIN: CRP: 6.7 mg/dL — ABNORMAL HIGH (ref <1.0)

## 2023-06-07 MED ORDER — SENNA 8.6 MG PO TABS
1.0000 | ORAL_TABLET | Freq: Two times a day (BID) | ORAL | Status: DC
  Administered 2023-06-07 – 2023-06-09 (×4): 8.6 mg via ORAL
  Filled 2023-06-07 (×4): qty 1

## 2023-06-07 MED ORDER — INSULIN ASPART 100 UNIT/ML IJ SOLN
0.0000 [IU] | Freq: Every day | INTRAMUSCULAR | Status: DC
Start: 2023-06-07 — End: 2023-06-09
  Administered 2023-06-08: 4 [IU] via SUBCUTANEOUS

## 2023-06-07 MED ORDER — RIVAROXABAN 10 MG PO TABS
10.0000 mg | ORAL_TABLET | Freq: Every day | ORAL | Status: DC
Start: 2023-06-07 — End: 2023-06-09
  Administered 2023-06-08 – 2023-06-09 (×2): 10 mg via ORAL
  Filled 2023-06-07 (×2): qty 1

## 2023-06-07 MED ORDER — OXYCODONE HCL 5 MG PO TABS
5.0000 mg | ORAL_TABLET | Freq: Once | ORAL | Status: AC
  Administered 2023-06-07: 5 mg via ORAL
  Filled 2023-06-07: qty 1

## 2023-06-07 MED ORDER — VANCOMYCIN HCL IN DEXTROSE 1-5 GM/200ML-% IV SOLN
1000.0000 mg | Freq: Two times a day (BID) | INTRAVENOUS | Status: DC
Start: 2023-06-07 — End: 2023-06-09
  Administered 2023-06-07 – 2023-06-08 (×3): 1000 mg via INTRAVENOUS
  Filled 2023-06-07 (×3): qty 200

## 2023-06-07 MED ORDER — PIPERACILLIN-TAZOBACTAM 3.375 G IVPB
3.3750 g | Freq: Three times a day (TID) | INTRAVENOUS | Status: DC
  Administered 2023-06-07 – 2023-06-09 (×5): 3.375 g via INTRAVENOUS
  Filled 2023-06-07 (×5): qty 50

## 2023-06-07 MED ORDER — ACETAMINOPHEN 650 MG RE SUPP: 650.0000 mg | Freq: Four times a day (QID) | RECTAL | Status: DC | PRN

## 2023-06-07 MED ORDER — INSULIN ASPART 100 UNIT/ML IJ SOLN
0.0000 [IU] | Freq: Three times a day (TID) | INTRAMUSCULAR | Status: DC
  Administered 2023-06-07: 7 [IU] via SUBCUTANEOUS
  Administered 2023-06-08 (×2): 5 [IU] via SUBCUTANEOUS
  Administered 2023-06-08: 7 [IU] via SUBCUTANEOUS
  Administered 2023-06-09: 2 [IU] via SUBCUTANEOUS
  Administered 2023-06-09: 7 [IU] via SUBCUTANEOUS

## 2023-06-07 MED ORDER — INSULIN ASPART 100 UNIT/ML IJ SOLN
6.0000 [IU] | Freq: Once | INTRAMUSCULAR | Status: AC
  Administered 2023-06-07: 6 [IU] via INTRAVENOUS

## 2023-06-07 MED ORDER — IBUPROFEN 400 MG PO TABS
600.0000 mg | ORAL_TABLET | Freq: Four times a day (QID) | ORAL | Status: DC | PRN
  Administered 2023-06-07 – 2023-06-08 (×2): 600 mg via ORAL
  Filled 2023-06-07 (×4): qty 1

## 2023-06-07 MED ORDER — GADOBUTROL 1 MMOL/ML IV SOLN
6.5000 mL | Freq: Once | INTRAVENOUS | Status: AC | PRN
Start: 2023-06-07 — End: 2023-06-07
  Administered 2023-06-07: 6.5 mL via INTRAVENOUS

## 2023-06-07 MED ORDER — ACETAMINOPHEN 500 MG PO TABS
1000.0000 mg | ORAL_TABLET | Freq: Three times a day (TID) | ORAL | Status: DC
  Administered 2023-06-07 – 2023-06-09 (×7): 1000 mg via ORAL
  Filled 2023-06-07 (×7): qty 2

## 2023-06-07 MED ORDER — MORPHINE SULFATE (PF) 4 MG/ML IV SOLN
4.0000 mg | Freq: Once | INTRAVENOUS | Status: AC
  Administered 2023-06-07: 4 mg via INTRAVENOUS
  Filled 2023-06-07: qty 1

## 2023-06-07 MED ORDER — PIPERACILLIN-TAZOBACTAM 3.375 G IVPB 30 MIN
3.3750 g | Freq: Once | INTRAVENOUS | Status: AC
  Administered 2023-06-07: 3.375 g via INTRAVENOUS
  Filled 2023-06-07: qty 50

## 2023-06-07 MED ORDER — VANCOMYCIN HCL 1500 MG/300ML IV SOLN
1500.0000 mg | Freq: Once | INTRAVENOUS | Status: AC
  Administered 2023-06-07: 1500 mg via INTRAVENOUS
  Filled 2023-06-07 (×2): qty 300

## 2023-06-07 MED ORDER — SODIUM CHLORIDE 0.9 % IV BOLUS
1000.0000 mL | Freq: Once | INTRAVENOUS | Status: AC
  Administered 2023-06-07: 1000 mL via INTRAVENOUS

## 2023-06-07 MED ORDER — ACETAMINOPHEN 325 MG PO TABS: 650.0000 mg | ORAL_TABLET | Freq: Four times a day (QID) | ORAL | Status: DC | PRN

## 2023-06-07 NOTE — Procedures (Signed)
 Incision and Drainage Procedure Note  Pre-operative Diagnosis: Abscess of left lower extremity  Post-operative Diagnosis: same  Indications: Acute infection and pain  Anesthesia: 1% plain lidocaine  Procedure Details  The procedure, risks and complications have been discussed in detail (including, but not limited to airway compromise, infection, bleeding) with the patient, and the patient has signed consent to the procedure.  The skin was sterilely prepped and draped over the affected area in the usual fashion. After adequate local anesthesia, I&D with a #11 blade was performed on the left anterior lower extremity. Purulent drainage: present The patient was observed until stable. The wound was packed with moistened gauze. Sterile dressings were applied.  Findings: Successful drainage of abscess.  EBL: 5 cc's  Drains: None  Condition: Tolerated procedure well  Complications: Moderate pain. Pt will receive one-time dose of oxycodone .   Carleen Chary DO IM Resident PGY-1 [facility]

## 2023-06-07 NOTE — Plan of Care (Signed)

## 2023-06-07 NOTE — ED Notes (Signed)
 Transported to MRI

## 2023-06-07 NOTE — H&P (Signed)
 Date: 06/07/2023               Patient Name:  Lawrence Mccormick MRN: [id number]  DOB: [birth year] Age / Sex: 43 y.o., male   PCP: Pcp, No (Inactive)         Medical Service: Internal Medicine Teaching Service         Attending Physician: Dr. Driscilla George, MD      First Contact: Reida Car  Pager:  [phone]  Second Contact: Jearldine Mina, DO  Pager:  [phone]       After Hours  (After 5pm / First Contact Pager: [phone]  weekends / holidays): Second Contact Pager: [phone]   SUBJECTIVE   Chief Complaint: left leg wound pain  History of Present Illness: Lawrence Mccormick is a 43 y.o. male with PMH of T2DM who presented to [facility] ED on 5/10 with concern of leg wound/pain.   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  Two days ago the wound and area started becoming painful and swollen without cause.  He presented to urgent care and received doxycycline .  He has since taken 3 doses of that medicine but came to ED this morning because of further pain, rated 7/10 and worsened with walking - radiating from his wound through his distal ankle. He also notes swelling of the area.  Although this wound was present for many years, it has never bothered him like this.  He cannot recall how he got the wound.  He endorses severe pain and new swelling but otherwise denies fevers, chills, sweats, headache, chest pain, abdominal pain, dyspnea, nausea or vomiting.  ED Course: Vitals of blood pressure 146/86 but otherwise unremarkable, including a temperature of 99 F and no recorded fevers Labs significant for glucose 430, sed rate 42, CRP 6.7.  White cells normal 8.6. Imaging: MRI tibia-fibula with evidence of cellulitis and a 3 x 0.8 x 2.2 cm abscess and no evidence of osteomyelitis Received 6 units insulin  aspart, morphine  4 mg, Zosyn and vancomycin, NS bolus Consulted IMTS for admission of cellulitis  Meds:  -metformin  500 mg BID -doxycycline  100 mg  BID x 7 days (took first three doses)  Past Medical History Past Medical History:  Diagnosis Date   Diabetes mellitus without complication (HCC)    Past Surgical History No past surgical history on file.  Social:  Lives With: mother Occupation: unemployed Support: mother, aunt  Level of Function: independent of ADLs PCP: no PCP Substances: -Tobacco: 2 cigarettes daily x 26 years -Alcohol: denies  -Recreational Drug: marijuana occasionally   Family History:  -reports father and aunt had cancer (unknown)  Allergies: Allergies as of 06/07/2023   (No Known Allergies)    Review of Systems: A complete ROS was negative except as per HPI.   OBJECTIVE:   Physical Exam: Blood pressure (!) 140/90, pulse 68, temperature 98.3 F (36.8 C), resp. rate 16, height 5\' 9"  (1.753 m), weight 65.8 kg, SpO2 99%.  Constitutional: AOx3, well-appearing man sitting in bed, in no acute distress, somewhat disheveled and anxious appearing HENT: normocephalic atraumatic, mucous membranes moist Eyes: conjunctiva non-erythematous Neck: supple Cardiovascular: regular rate and rhythm, no m/r/g Pulmonary/Chest: normal work of breathing on room air, lungs clear to auscultation bilaterally Abdominal: bowel sounds normal, soft, non-tender, non-distended MSK: normal bulk and tone Neurological: alert & oriented x 3 Skin: warm and dry. A chronic lesion with signs of acute infection including warmth and swelling present on L  shin with evidence of superficial abscess. No drainage tracts. Psych: Appropriate mood and affect   Chronic wound and abscess at L (10 o'clock)  Labs: CBC    Component Value Date/Time   WBC 8.6 06/07/2023 0615   RBC 4.42 06/07/2023 0615   HGB 13.6 06/07/2023 0615   HCT 38.8 (L) 06/07/2023 0615   PLT 236 06/07/2023 0615   MCV 87.8 06/07/2023 0615   MCH 30.8 06/07/2023 0615   MCHC 35.1 06/07/2023 0615   RDW 11.9 06/07/2023 0615   LYMPHSABS 1.7 06/07/2023 0615   MONOABS 1.2  (H) 06/07/2023 0615   EOSABS 0.1 06/07/2023 0615   BASOSABS 0.0 06/07/2023 0615     CMP     Component Value Date/Time   NA 133 (L) 06/07/2023 0615   K 3.9 06/07/2023 0615   CL 95 (L) 06/07/2023 0615   CO2 27 06/07/2023 0615   GLUCOSE 430 (H) 06/07/2023 0615   BUN 12 06/07/2023 0615   CREATININE 1.03 06/07/2023 0615   CREATININE 0.97 01/05/2020 1428   CALCIUM 9.3 06/07/2023 0615   PROT 6.7 07/03/2020 0427   ALBUMIN 3.0 (L) 07/03/2020 0427   AST 56 (H) 07/03/2020 0427   ALT 107 (H) 07/03/2020 0427   ALKPHOS 416 (H) 07/03/2020 0427   BILITOT 2.6 (H) 07/03/2020 0427   GFRNONAA >60 06/07/2023 0615   GFRAA >60 06/24/2015 1805   Imaging:  MR TIBIA FIBULA LEFT W WO CONTRAST CLINICAL DATA:  Left lower extremity soft tissue wound  EXAM: MRI OF LOWER LEFT EXTREMITY WITHOUT AND WITH CONTRAST  TECHNIQUE: Multiplanar, multisequence MR imaging of the left lower leg was performed both before and after administration of intravenous contrast.  CONTRAST:  6.5mL GADAVIST  GADOBUTROL  1 MMOL/ML IV SOLN  COMPARISON:  X-ray 06/07/2023  FINDINGS: Bones/Joint/Cartilage  Left tibia and fibula are intact. No fracture or dislocation. No erosion. No bone marrow edema or marrow replacement. No bone lesion.  Ligaments  No acute ligamentous abnormality is evident at the edges of the field of view.  Muscles and Tendons  Mild intramuscular edema within the tibialis anterior muscle approximating the overlying fluid collection suggestive of a reactive myositis. No intramuscular fluid collection. No muscle atrophy. Imaged tendinous structures are intact.  Soft tissues  Soft tissue swelling and edema most pronounced at the anterior aspect of the lower leg. Within the subcutaneous soft tissues anterior to the mid left tibial diaphysis is a well-defined rim enhancing fluid collection measuring 3.0 x 0.8 x 2.2 cm. There appears to be a wound overlying the site of collection. Soft tissue edema  extends distally to the level of the ankle/hindfoot suggesting cellulitis. No additional fluid collections. No deep fascial fluid or edema.  IMPRESSION: 1. Soft tissue swelling and edema most pronounced at the anterior aspect of the lower leg compatible with cellulitis. Within the subcutaneous soft tissues anterior to the mid left tibial diaphysis is a well-defined rim-enhancing fluid collection measuring 3.0 x 0.8 x 2.2 cm, compatible with abscess. 2. Mild intramuscular edema within the tibialis anterior muscle approximating the overlying fluid collection suggestive of a reactive myositis. 3. No evidence of osteomyelitis.  Electronically Signed   By: Leverne Reading D.O.   On: 06/07/2023 10:34 DG Tibia/Fibula Left CLINICAL DATA:  Mid anterior tibial soft tissue wound, lower extremity pain  EXAM: LEFT TIBIA AND FIBULA - 2 VIEW  COMPARISON:  06/05/2023  FINDINGS: Intact left tibia and fibula. No acute osseous finding or fracture. No cortical abnormality. On the lateral view, there is slight  similar anterior tibial soft tissue prominence/swelling. No underlying radiopaque foreign body.  IMPRESSION: Anterior tibial soft tissue swelling. No acute osseous finding.  Electronically Signed   By: Melven Stable.  Shick M.D.   On: 06/07/2023 08:47  ASSESSMENT & PLAN:   Assessment & Plan by Problem: Principal Problem:   Cellulitis and abscess of left lower extremity  Lawrence Mccormick is a 43 y.o. male with PMH of uncontrolled T2DM who presented to [facility] ED on 5/10 with concern of leg wound/pain. He is admitted for cellulitis with abscess of the LLE.  Cellulitis and abscess of LLE Will admit for acute infection of a chronic wound. Unclear etiology but compelling risk factor of diabetic disease without recent medical follow-up. A1c was 11.5 in 2022 and will reassess here. Fortunately he is without constitutional signs, fever, leukocytosis. The presence of abscess would likely benefit from  drainage. MRI without evidence of osteomyelitis. - Started on Vancomycin and Zosyn in ED and will continue that, consider deescalating tomorrow - drain abscess today when able - diabetic control  T2DM He continues to take metformin  for this but was lost to follow up for some time. He is to establish care with a new PCP in July but will do what we can to help while here. Received 6u insulin  aspart in ED. - collect A1c - SSI - Anticipate continuation of metformin  and additional therapies upon workup  Diet: Carb-Modified VTE: DOAC IVF: None,None Code: Full Surrogate Decision Maker: mother Andres Kea)  Prior to Admission Living Arrangement: Home, living with mother Anticipated Discharge Location: Home Barriers to Discharge: Treatment  Dispo: Admit patient to Observation with expected length of stay less than 2 midnights.  Signed: Carleen Chary, DO Internal Medicine Resident PGY-2 06/07/2023, 12:29 PM   Please contact IM Residency On-Call Pager at: [phone] or [phone].

## 2023-06-07 NOTE — ED Triage Notes (Signed)
 Patient presents to ed via [facility] c/o pain in his left lower leg. States the wound has been there since he was 43 years old. C/o increased  swelling last several days , states he got up to go to the bathroom this am and had severe pain in his left lower leg. Denies injury

## 2023-06-07 NOTE — ED Provider Notes (Signed)
 Patient seen after prior ED provider.  Patient would benefit from admission given uncontrolled diabetes and concern for cellulitis/early abscess of the left leg.  TG team aware of case and will evaluate for admission.   Burnette Carte, MD 06/07/23 [phone]

## 2023-06-07 NOTE — Progress Notes (Signed)
 ED Pharmacy Antibiotic Sign Off An antibiotic consult was received from an ED provider for vancomycin per pharmacy dosing for cellulitis. A chart review was completed to assess appropriateness.   The following one time order(s) were placed:  Vancomycin 1500mg    Further antibiotic and/or antibiotic pharmacy consults should be ordered by the admitting provider if indicated.   Thank you for allowing pharmacy to be a part of this patient's care.   Fonda Hymen, [facility]  Clinical Pharmacist 06/07/23 8:35 AM

## 2023-06-07 NOTE — ED Provider Notes (Signed)
 [place] EMERGENCY DEPARTMENT AT [facility] Provider Note   CSN: [id number] Arrival date & time: 06/07/23  0555     History  Chief Complaint  Patient presents with   Leg Pain    Lawrence Mccormick is a 43 y.o. male.  The history is provided by the patient.  Leg Pain He has history of diabetes and comes in because of pain and swelling in his left lower leg.  He has a chronic scar that had been there since he was 14, but he started noticing some swelling and pain about 3 days ago.  He did go to an urgent care center 2 days ago and was given a prescription for doxycycline .  He states that he has taken the antibiotic, but pain and swelling of gotten worse.  He denies fever, chills, sweats.  He denies nausea or vomiting.   Home Medications Prior to Admission medications   Medication Sig Start Date End Date Taking? Authorizing Provider  dicyclomine  (BENTYL ) 20 MG tablet Take 1 tablet (20 mg total) by mouth 2 (two) times daily as needed for up to 3 days for spasms. 07/28/22 07/31/22  Gowens, Mariah L, PA-C  doxycycline  (VIBRAMYCIN ) 100 MG capsule Take 1 capsule (100 mg total) by mouth 2 (two) times daily for 7 days. 06/05/23 06/12/23  Rising, Ivette Marks, PA-C  emtricitabine -tenofovir  AF (DESCOVY ) 200-25 MG tablet Take 1 tablet by mouth daily. 07/27/20   Orson Blalock, NP  lisinopril  (ZESTRIL ) 10 MG tablet Take 1 tablet (10 mg total) by mouth daily. Patient not taking: Reported on 07/27/2020 07/03/20 07/03/21  Sheril Dines, MD  metFORMIN  (GLUCOPHAGE ) 500 MG tablet Take 1 tablet (500 mg total) by mouth 2 (two) times daily with a meal. 06/05/23   Rising, Ivette Marks, PA-C      Allergies    Patient has no known allergies.    Review of Systems   Review of Systems  All other systems reviewed and are negative.   Physical Exam Updated Vital Signs BP (!) 146/86 (BP Location: Right Arm)   Pulse 92   Temp 99 F (37.2 C) (Oral)   Resp 20   Ht 5\' 9"  (1.753 m)   Wt 65.8 kg   SpO2 100%   BMI  21.42 kg/m  Physical Exam Vitals and nursing note reviewed.   43 year old male, resting comfortably and in no acute distress. Vital signs are significant for mildly elevated blood pressure. Oxygen saturation is 100%, which is normal. Head is normocephalic and atraumatic. PERRLA, EOMI.  Lungs are clear without rales, wheezes, or rhonchi. Chest is nontender. Heart has regular rate and rhythm without murmur. Abdomen is soft, flat, nontender. Extremities: There is soft tissue swelling of the left lower leg from about the mid tibia down.  This area is warm and tender to the touch.  There is a lesion which appears to be an old wound present at about midway between the knee and ankle, but there is no drainage.  There is minimal fluctuance.  Distal neurovascular exam is intact with strong dorsalis pedis pulse, prompt capillary refill, normal sensation.  Remainder of extremity exam is unremarkable. Skin is warm and dry without rash. Neurologic: Mental status is normal, moves all extremities equally.    ED Results / Procedures / Treatments   Labs (all labs ordered are listed, but only abnormal results are displayed) Labs Reviewed  BASIC METABOLIC PANEL WITH GFR  CBC WITH DIFFERENTIAL/PLATELET  SEDIMENTATION RATE  C-REACTIVE PROTEIN    EKG  None  Radiology DG Tibia/Fibula Left Result Date: 06/05/2023 CLINICAL DATA:  chronic non healing wound, tenderness with erythema and swelling x 1 week EXAM: LEFT TIBIA AND FIBULA - 2 VIEW COMPARISON:  None Available. FINDINGS: No acute fracture or dislocation. There is no evidence of arthropathy or other focal bone abnormality. Subcutaneous edema. IMPRESSION: No acute fracture or dislocation.  No radiopaque foreign body. Electronically Signed   By: Rance Burrows M.D.   On: 06/05/2023 16:04    Procedures Procedures    Medications Ordered in ED Medications  morphine  (PF) 4 MG/ML injection 4 mg (has no administration in time range)    ED Course/  Medical Decision Making/ A&P                                 Medical Decision Making Amount and/or Complexity of Data Reviewed Labs: ordered. Radiology: ordered.  Risk Prescription drug management.   Pain and swelling of the left lower leg concerning for cellulitis versus osteomyelitis.  I reviewed his past records, and note urgent care visit on 06/05/2023 with x-ray of left tibia/fibula which was read as negative.  I have ordered laboratory workup of CBC, basic metabolic panel, ESR, CRP and I have ordered MRI of the lower leg to look for evidence of osteomyelitis.  I have ordered morphine  for pain.  Case is signed out to Dr. Reba Camper, oncoming physician.  Final Clinical Impression(s) / ED Diagnoses Final diagnoses:  Pain and swelling of left lower leg    Rx / DC Orders ED Discharge Orders     None         Alissa April, MD 06/07/23 [phone]

## 2023-06-07 NOTE — Progress Notes (Signed)
 Pharmacy Antibiotic Note  EULAN BOURQUIN is a 43 y.o. male admitted on 06/07/2023 with wound infection.  Patient is s/p bedside I&D on 5/10.  Pharmacy has been consulted for Vancomycin and Zosyn dosing.  Patient received Vancomycin 1500mg  IV x 1 and Zosyn 3.375gm IV x 1 in ED ~ 10am.  Plan: Vancomycin 1000mg   IV every 12 hours.  Goal AUC 400-600. (SCr 1, Vd 0.72, cAUC 562)  Zosyn 3.375g IV q8h (4 hour infusion).  Height: 5\' 9"  (175.3 cm) Weight: 65.8 kg (145 lb 1 oz) IBW/kg (Calculated) : 70.7  Temp (24hrs), Avg:99.2 F (37.3 C), Min:98.3 F (36.8 C), Max:100 F (37.8 C)  Recent Labs  Lab 06/07/23 0615  WBC 8.6  CREATININE 1.03    Estimated Creatinine Clearance: 85.2 mL/min (by C-G formula based on SCr of 1.03 mg/dL).    No Known Allergies  Antimicrobials this admission: Vanc 5/10 >>  Zosyn 5/10 >>   Dose adjustments this admission:   Microbiology results:   Thank you for allowing pharmacy to be a part of this patient's care.  Wells Halim 06/07/2023 5:17 PM

## 2023-06-08 DIAGNOSIS — L03116 Cellulitis of left lower limb: Secondary | ICD-10-CM | POA: Diagnosis present

## 2023-06-08 DIAGNOSIS — Z79899 Other long term (current) drug therapy: Secondary | ICD-10-CM | POA: Diagnosis not present

## 2023-06-08 DIAGNOSIS — M79662 Pain in left lower leg: Secondary | ICD-10-CM | POA: Diagnosis present

## 2023-06-08 DIAGNOSIS — L02416 Cutaneous abscess of left lower limb: Secondary | ICD-10-CM | POA: Diagnosis present

## 2023-06-08 DIAGNOSIS — Z7984 Long term (current) use of oral hypoglycemic drugs: Secondary | ICD-10-CM | POA: Diagnosis not present

## 2023-06-08 DIAGNOSIS — E1165 Type 2 diabetes mellitus with hyperglycemia: Secondary | ICD-10-CM | POA: Diagnosis present

## 2023-06-08 DIAGNOSIS — Z560 Unemployment, unspecified: Secondary | ICD-10-CM | POA: Diagnosis not present

## 2023-06-08 DIAGNOSIS — Z794 Long term (current) use of insulin: Secondary | ICD-10-CM | POA: Diagnosis not present

## 2023-06-08 DIAGNOSIS — Z87891 Personal history of nicotine dependence: Secondary | ICD-10-CM | POA: Diagnosis not present

## 2023-06-08 LAB — CBC
HCT: 42.4 % (ref 39.0–52.0)
Hemoglobin: 14.6 g/dL (ref 13.0–17.0)
MCH: 30.9 pg (ref 26.0–34.0)
MCHC: 34.4 g/dL (ref 30.0–36.0)
MCV: 89.8 fL (ref 80.0–100.0)
Platelets: 251 10*3/uL (ref 150–400)
RBC: 4.72 MIL/uL (ref 4.22–5.81)
RDW: 11.9 % (ref 11.5–15.5)
WBC: 10.7 10*3/uL — ABNORMAL HIGH (ref 4.0–10.5)
nRBC: 0 % (ref 0.0–0.2)

## 2023-06-08 LAB — BASIC METABOLIC PANEL WITH GFR
Anion gap: 12 (ref 5–15)
BUN: 15 mg/dL (ref 6–20)
CO2: 25 mmol/L (ref 22–32)
Calcium: 9.5 mg/dL (ref 8.9–10.3)
Chloride: 98 mmol/L (ref 98–111)
Creatinine, Ser: 0.88 mg/dL (ref 0.61–1.24)
GFR, Estimated: >60 mL/min (ref >60)
Glucose, Bld: 207 mg/dL — ABNORMAL HIGH (ref 70–99)
Potassium: 4.4 mmol/L (ref 3.5–5.1)
Sodium: 135 mmol/L (ref 135–145)

## 2023-06-08 LAB — GLUCOSE, CAPILLARY
Glucose-Capillary: 324 mg/dL — ABNORMAL HIGH (ref 70–99)
Glucose-Capillary: 295 mg/dL — ABNORMAL HIGH (ref 70–99)
Glucose-Capillary: 284 mg/dL — ABNORMAL HIGH (ref 70–99)
Glucose-Capillary: 339 mg/dL — ABNORMAL HIGH (ref 70–99)

## 2023-06-08 MED ORDER — IBUPROFEN 200 MG PO TABS
600.0000 mg | ORAL_TABLET | Freq: Four times a day (QID) | ORAL | Status: DC | PRN
  Administered 2023-06-09: 600 mg via ORAL
  Filled 2023-06-08: qty 3

## 2023-06-08 MED ORDER — INSULIN GLARGINE-YFGN 100 UNIT/ML ~~LOC~~ SOLN
10.0000 [IU] | Freq: Every day | SUBCUTANEOUS | Status: DC
  Administered 2023-06-08: 10 [IU] via SUBCUTANEOUS
  Filled 2023-06-08 (×2): qty 0.1

## 2023-06-08 NOTE — Plan of Care (Signed)

## 2023-06-08 NOTE — Progress Notes (Signed)
 HD#0 SUBJECTIVE:  Patient Summary: Lawrence Mccormick is a 43 y.o. man with PMH of T2DM who presented to [facility] ED on 5/10 with concern of leg wound/pain and was admitted for cellulitis with abscess.  Overnight Events and Interim History: Bedside I&D yesterday drained small pocket of puss. He continues to endorses pain in the leg with walking, 10/10 at times, less at rest. Feels that his swelling has improved. Denies fevers or chills. Agreeable to insulin  for his diabetes.  OBJECTIVE:  Vital Signs: Vitals:   06/07/23 1557 06/07/23 2034 06/08/23 0527 06/08/23 0746  BP: 136/87 (!) 158/106 (!) 150/99 (!) 134/91  Pulse: 85 92 84 87  Resp: 16 17 18 18   Temp: 100 F (37.8 C) 100.2 F (37.9 C) 99.1 F (37.3 C) 99.5 F (37.5 C)  TempSrc: Oral Oral Oral Oral  SpO2: 100% 100% 100% 100%  Weight:      Height:       Supplemental O2: Room Air SpO2: 100 %  Filed Weights   06/07/23 4098  Weight: 65.8 kg     Intake/Output Summary (Last 24 hours) at 06/08/2023 1010 Last data filed at 06/08/2023 0300 Gross per 24 hour  Intake 549.24 ml  Output --  Net 549.24 ml   Net IO Since Admission: 549.24 mL [06/08/23 1010]  Physical Exam: Physical Exam Constitutional:      General: He is not in acute distress.    Appearance: He is not ill-appearing.  Cardiovascular:     Rate and Rhythm: Normal rate and regular rhythm.     Heart sounds: Murmur heard.     Comments: 1/6 systolic murmur R sternal border Pulmonary:     Effort: Pulmonary effort is normal.     Breath sounds: Normal breath sounds.  Abdominal:     General: Abdomen is flat.     Tenderness: There is no abdominal tenderness.  Musculoskeletal:        General: Swelling and tenderness present.     Right lower leg: No edema.     Left lower leg: Edema present.     Comments: Edema slightly better. Very TTP. I/D site clean and bandaged, scant blood expressed.  Neurological:     General: No focal deficit present.     Mental Status: He is  alert. Mental status is at baseline.  Psychiatric:        Mood and Affect: Mood normal.        Behavior: Behavior normal.    Patient Lines/Drains/Airways Status     Active Line/Drains/Airways     Name Placement date Placement time Site Days   Peripheral IV 06/07/23 20 G Anterior;Distal;Right;Upper Arm 06/07/23  0625  Arm  1            ASSESSMENT/PLAN:  Assessment: Principal Problem:   Cellulitis and abscess of left lower extremity Active Problems:   Type 2 diabetes mellitus with hyperglycemia, without long-term current use of insulin  (HCC)  Lawrence Mccormick is a 43 y.o. man with PMH of T2DM who presented to [facility] ED on 5/10 with concern of leg wound/pain and was admitted for cellulitis with abscess.  Plan: Cellulitis and abscess of LLE S/P I&D of abscess Improving. Very mild leukocytosis today but afebrile. On exam, some improvement in swelling and warmth. No concern of a deep or spreading infection at present. Still has pain particularly with ambulation, noted 10/10 at times. Also need to nail down diabetes plan too so will keep him another night at least. Diabetes is  his major risk factor. MRI without evidence of osteomyelitis. - Continue Vancomycin and Zosyn today, anticipate orals doxycycline  and Augmentin tomorrow - Abscess drained yesterday, some bloody purulence, looks good today - diabetic control   T2DM He started metformin  for this at recent urgent care visit but was otherwise without treatment for some time. Historic A1c above 11. He is to establish care with a new PCP in July but will do what we can to help while here, likely follow-up in our IM clinic. - A1c pending, but glucose above 300 and needing 20 units SSI insulin  since admission  - continue SSI, start semglee 10 daily - Diabetes education for likely discharge on maintenance insulin   Best Practice: Diet: Diabetic diet IVF: n/a VTE: rivaroxaban (XARELTO) tablet 10 mg Start: 06/07/23 1145 Code: Full AB:  Vanc and Zosyn Pain Medicine: Tylenol  Bowel Regimen: Senokot Therapy Recs: n/a DISPO: Anticipated discharge tomorrow to Home pending medical readiness.  Signature: Carleen Chary, D.O.  Internal Medicine Resident, PGY-1 [facility]  Pager: # [phone]. 10:10 AM, 06/08/2023

## 2023-06-09 ENCOUNTER — Telehealth (HOSPITAL_COMMUNITY): Payer: Self-pay | Admitting: Pharmacy Technician

## 2023-06-09 ENCOUNTER — Other Ambulatory Visit (HOSPITAL_COMMUNITY): Payer: Self-pay

## 2023-06-09 DIAGNOSIS — Z794 Long term (current) use of insulin: Secondary | ICD-10-CM

## 2023-06-09 DIAGNOSIS — E1165 Type 2 diabetes mellitus with hyperglycemia: Secondary | ICD-10-CM | POA: Diagnosis not present

## 2023-06-09 DIAGNOSIS — Z7984 Long term (current) use of oral hypoglycemic drugs: Secondary | ICD-10-CM | POA: Diagnosis not present

## 2023-06-09 DIAGNOSIS — L02416 Cutaneous abscess of left lower limb: Secondary | ICD-10-CM | POA: Diagnosis not present

## 2023-06-09 DIAGNOSIS — L03116 Cellulitis of left lower limb: Secondary | ICD-10-CM | POA: Diagnosis not present

## 2023-06-09 LAB — GLUCOSE, CAPILLARY
Glucose-Capillary: 329 mg/dL — ABNORMAL HIGH (ref 70–99)
Glucose-Capillary: 159 mg/dL — ABNORMAL HIGH (ref 70–99)

## 2023-06-09 LAB — CBC
HCT: 36 % — ABNORMAL LOW (ref 39.0–52.0)
Hemoglobin: 12.6 g/dL — ABNORMAL LOW (ref 13.0–17.0)
MCH: 30.6 pg (ref 26.0–34.0)
MCHC: 35 g/dL (ref 30.0–36.0)
MCV: 87.4 fL (ref 80.0–100.0)
Platelets: 232 10*3/uL (ref 150–400)
RBC: 4.12 MIL/uL — ABNORMAL LOW (ref 4.22–5.81)
RDW: 11.9 % (ref 11.5–15.5)
WBC: 9.5 10*3/uL (ref 4.0–10.5)
nRBC: 0 % (ref 0.0–0.2)

## 2023-06-09 LAB — BASIC METABOLIC PANEL WITH GFR
Anion gap: 10 (ref 5–15)
BUN: 10 mg/dL (ref 6–20)
CO2: 27 mmol/L (ref 22–32)
Calcium: 8.7 mg/dL — ABNORMAL LOW (ref 8.9–10.3)
Chloride: 95 mmol/L — ABNORMAL LOW (ref 98–111)
Creatinine, Ser: 0.79 mg/dL (ref 0.61–1.24)
GFR, Estimated: >60 mL/min (ref >60)
Glucose, Bld: 314 mg/dL — ABNORMAL HIGH (ref 70–99)
Potassium: 3.5 mmol/L (ref 3.5–5.1)
Sodium: 132 mmol/L — ABNORMAL LOW (ref 135–145)

## 2023-06-09 LAB — HEMOGLOBIN A1C
Hgb A1c MFr Bld: 12.7 % — ABNORMAL HIGH (ref 4.8–5.6)
Mean Plasma Glucose: 318 mg/dL

## 2023-06-09 MED ORDER — PEN NEEDLES 32G X 4 MM MISC
0 refills | Status: AC
  Filled 2023-06-09: qty 100, 30d supply, fill #0

## 2023-06-09 MED ORDER — INSULIN GLARGINE-YFGN 100 UNIT/ML ~~LOC~~ SOLN
14.0000 [IU] | Freq: Every day | SUBCUTANEOUS | Status: DC
  Administered 2023-06-09: 14 [IU] via SUBCUTANEOUS
  Filled 2023-06-09: qty 0.14

## 2023-06-09 MED ORDER — FREESTYLE LIBRE 3 PLUS SENSOR MISC
0 refills | Status: AC
  Filled 2023-06-09: qty 1, 15d supply, fill #0

## 2023-06-09 MED ORDER — DOXYCYCLINE HYCLATE 100 MG PO TABS
100.0000 mg | ORAL_TABLET | Freq: Two times a day (BID) | ORAL | Status: DC
  Administered 2023-06-09: 100 mg via ORAL
  Filled 2023-06-09: qty 1

## 2023-06-09 MED ORDER — LANTUS SOLOSTAR 100 UNIT/ML ~~LOC~~ SOPN
14.0000 [IU] | PEN_INJECTOR | Freq: Every day | SUBCUTANEOUS | 11 refills | Status: AC
  Filled 2023-06-09: qty 12, 85d supply, fill #0

## 2023-06-09 MED ORDER — DOXYCYCLINE HYCLATE 100 MG PO TABS
100.0000 mg | ORAL_TABLET | Freq: Two times a day (BID) | ORAL | 0 refills | Status: DC
  Filled 2023-06-09: qty 9, 5d supply, fill #0

## 2023-06-09 MED ORDER — OXYCODONE HCL 5 MG PO TABS
5.0000 mg | ORAL_TABLET | Freq: Once | ORAL | Status: AC
  Administered 2023-06-09: 5 mg via ORAL
  Filled 2023-06-09: qty 1

## 2023-06-09 MED ORDER — SENNA 8.6 MG PO TABS
1.0000 | ORAL_TABLET | Freq: Every evening | ORAL | 0 refills | Status: AC | PRN
  Filled 2023-06-09: qty 30, 30d supply, fill #0

## 2023-06-09 MED ORDER — OXYCODONE HCL 5 MG PO TABS
5.0000 mg | ORAL_TABLET | Freq: Three times a day (TID) | ORAL | 0 refills | Status: AC | PRN
  Filled 2023-06-09: qty 9, 3d supply, fill #0

## 2023-06-09 MED ORDER — ACETAMINOPHEN 500 MG PO TABS
1000.0000 mg | ORAL_TABLET | Freq: Three times a day (TID) | ORAL | 0 refills | Status: AC
  Filled 2023-06-09: qty 30, 5d supply, fill #0

## 2023-06-09 MED ORDER — BLOOD GLUCOSE TEST VI STRP
1.0000 | ORAL_STRIP | Freq: Three times a day (TID) | 0 refills | Status: AC
  Filled 2023-06-09: qty 100, 30d supply, fill #0

## 2023-06-09 MED ORDER — LANCET DEVICE MISC
1.0000 | Freq: Three times a day (TID) | 0 refills | Status: AC
  Filled 2023-06-09: qty 1, 30d supply, fill #0

## 2023-06-09 MED ORDER — ACCU-CHEK SOFTCLIX LANCETS MISC
1.0000 | Freq: Three times a day (TID) | 0 refills | Status: AC
  Filled 2023-06-09: qty 100, 30d supply, fill #0

## 2023-06-09 MED ORDER — IBUPROFEN 600 MG PO TABS
600.0000 mg | ORAL_TABLET | Freq: Four times a day (QID) | ORAL | 0 refills | Status: AC | PRN
  Filled 2023-06-09: qty 28, 7d supply, fill #0

## 2023-06-09 MED ORDER — BLOOD GLUCOSE MONITOR SYSTEM W/DEVICE KIT
1.0000 | PACK | Freq: Three times a day (TID) | 0 refills | Status: AC
  Filled 2023-06-09: qty 1, 30d supply, fill #0

## 2023-06-09 MED ORDER — AMOXICILLIN-POT CLAVULANATE 875-125 MG PO TABS
1.0000 | ORAL_TABLET | Freq: Two times a day (BID) | ORAL | 0 refills | Status: DC
  Filled 2023-06-09: qty 9, 5d supply, fill #0

## 2023-06-09 MED ORDER — AMOXICILLIN-POT CLAVULANATE 875-125 MG PO TABS
1.0000 | ORAL_TABLET | Freq: Two times a day (BID) | ORAL | Status: DC
  Administered 2023-06-09: 1 via ORAL
  Filled 2023-06-09: qty 1

## 2023-06-09 NOTE — Plan of Care (Signed)

## 2023-06-09 NOTE — Telephone Encounter (Signed)
 Pharmacy Patient Advocate Encounter   Received notification from Inpatient Request that prior authorization for FreeStyle Libre 3 Plus Sensor is required/requested.   Insurance verification completed.   The patient is insured through [facility] .   Per test claim: PA required; PA submitted to above mentioned insurance via CoverMyMeds Key/confirmation #/EOC [facility] Status is pending

## 2023-06-09 NOTE — Telephone Encounter (Signed)
 Patient Product/process development scientist completed.    The patient is insured through [employer].     Ran test claim for Lantus Pen and the current 30 day co-pay is $4.00.  Ran test claim for [employer] and Requires Prior Authorization  Ran test claim for [employer] and Requires Prior Authorization  This test claim was processed through [employer]- copay amounts may vary at other pharmacies due to [employer], or as the patient moves through the different stages of their insurance plan.     Morgan Arab, CPHT Pharmacy Technician III Certified Patient Advocate [facility] Patient Advocate Team Direct Number: [phone]  Fax: [phone]

## 2023-06-09 NOTE — Discharge Instructions (Signed)
 [employer] assistance programs Crisis assistance programs  -[facility]eless[facility] Arts development officer. If you are experiencing homelessness in [place], [place] , your first point of contact should be Pensions consultant. You can reach Coordinated Entry by calling ([phone] or by emailing [email or web address].  Community access points: [employer] [phone], [place]) every Tuesday from 9am-10am. [facility] ([address] [place] [address], [place]) every Wednesday from 8am-9am.   -[place] Coordinated Re-entry Jayson Michael: Dial 211 and request. Offers referrals to homeless shelters in the area.    -The [employer] [phone]) offers several services to local families, as funding allows. The Emergency Assistance Program (EAP), which they administer, provides household goods, free food, clothing, and financial aid to people in need in the [place]  area. The EAP program does have some qualification, and counselors will interview clients for financial assistance by written referral only. Referrals need to be made by the Department of Social Services or by other EAP approved human services agencies or charities in the area.  -Open [facility] Ministries of [employer], which can be reached at ([phone], offers emergency assistance programs for those in need of help, such as food, rent assistance, a soup kitchen, shelter, and clothing. They are based in [facility]  but provide a number of services to those that qualify for assistance.   [facility] Department of Social Services may be able to offer temporary financial assistance and cash grants for paying rent and utilities, Help may be provided for local county residents who may be experiencing personal crisis when other resources, including government programs, are not available. Call [phone]  -High [employer] is a [employer] agency, The organization can offer emergency assistance for paying rent, Care[employer] Rx, utilities, food, household products and furniture. They offer extensive emergency and transitional housing for families, children and single women, and also run a Boy's and [employer]. Thrift Shops, Secondary school teacher, and other aid offered too. [address] [place], [place]  [address], [phone]  -[place] Low Income Energy Assistance Program -- This is offered for [facility] families. The federal government created [employer] Program provides a one-time cash grant payment to help eligible low-income families pay their electric and heating bills. [address], [place], [place]  [address], [phone]  -[place] Emergency Assistance -- A program offers emergency utility and rent funds for greater [employer] area residents. The program can also provide counseling and referrals to charities and government programs. Also provides food and a free meal program that serves lunch Mondays - Saturdays and dinner seven days per week to individuals in the community. [address], [place], [place]  [address], [phone]  -[employer] - Offers affordable apartment and housing communities across      [place] and [place]. The low income and seniors can access public housing, rental assistance to qualified applicants, and apply for the section 8 rent subsidy program. Other programs include Chiropractor and Engineer, maintenance. [address] [place],   [address], dial [phone].  -The [facility] provides transitional housing to veterans and the disabled. Clients will also access other services too, including assistance in applying for Disability, life skills classes, case management, and assistance in finding permanent housing. [address], [place], [place]  [place] [address], call [phone]  -[place] Transitional Housing through [facility] is for people who were just  evicted or that are formerly homeless. The non-profit will also help then gain self-sufficiency, find a home or apartment to live in, and also provides information on rent assistance when needed. Phone [phone]  -The [place] [facility] [facility] helps low income, elderly, or disabled residents in seven counties in the [place] ([place], [place], [place], [place], [place], [place], [place], and [place]) save energy and reduce their utility bills by improving energy efficiency. Phone [phone].  -[employer] is located in the [place] [facility] in the [employer], [address], [place], [place] [address]. Parking is in the rear of the building. Phone: [phone]   General Email: info@gsohc .org  GHC provides free housing counseling assistance in locating affordable rental housing or housing with support services for families and individuals in crisis and the chronically homeless. We provide potential resources for other housing needs like utilities. Our trained counselors also work with clients on budgeting and financial literacy in effort to empower them to take control of their financial situations. [employer] collaborates with homeless service providers and other stakeholders as part of the [employer] COC (Continuum of Care). The (COC) is a regional/local planning body that coordinates housing and services funding for homeless families and individuals. The role of [facility] in the COC is through housing counseling to work with people we serve on diversion strategies for those that are at imminent risk of becoming homeless. We also work with the Coordinated Assessment/Entry Specialist who attempts to find temporary solutions and/or connects the people  to Housing First, Rapid Re-housing or transitional housing programs. Our Homelessness Prevention Housing Counselors meet with clients on business days (Monday-Fridays, except scheduled holidays) from 8:30 am to 4:30 pm.  Legal assistance for evictions, foreclosure, and more -If you need free legal advice on civil issues, such as foreclosures, evictions, Electronics engineer, government programs, domestic issues and more, Armed forces operational officer Aid of [place]  [facility]) is a Associate Professor firm that provides free legal services and counsel to lower income people, seniors, disabled, and others, The goal is to ensure everyone has access to justice and fair representation. Call them at [phone].  [facility] for Housing and Community Studies can provide info about obtaining legal assistance with evictions. Phone [phone].  Data processing manager  The [employer], [employer]. offers job and Dispensing optician. Resources are focused on helping students obtain the skills and experiences that are necessary to compete in today's challenging and tight job market. The non-profit faith-based community action agency offers internship trainings as well as classroom instruction. Classes are tailored to meet the needs of people in the [facility] region. [place], [place] [address], [phone]  Foreclosure prevention/Debt Services Family Services of the [employer] Credit Counseling Service inludes debt and foreclosure prevention programs for local families. This includes money management, financial advice, budget review and development of a written action plan with a Pensions consultant to help solve specific individual financial problems. In addition, housing and mortgage counselors can also provide pre- and post-purchase homeownership counseling, default resolution counseling (to prevent foreclosure) and reverse mortgage counseling. A Debt Management Program allows  people and families with a high level of credit card or medical debt to consolidate and repay consumer debt and loans to creditors and rebuild positive credit ratings and scores. Contact ([phone] F1555895.  Community clinics in [place] -Health Department [facility]: [address], [place], [address]. [phone].  -Health Department [place] [facility]: [phone]  [address], [facility], [address]. [phone].  -[facility] offers medical care through a group of doctors, pharmacies and other healthcare related agencies that offer services for low income, uninsured adults in [place]. Also offers adult Dental care and assistance with applying for an [employer]. Call [phone].   [facility]. This center provides low-cost health care to those without health insurance. Services offered include an onsite pharmacy. Phone [phone]. [address], [address], [place].  -Medication Assistance Program serves as a link between pharmaceutical companies and patients to provide low cost or free prescription medications. This service is available for residents who meet certain income restrictions and have no insurance coverage. PLEASE CALL [phone] Jonette Nestle) OR [phone] ([place])  -One Step Further: Materials engineer, The [employer] Support & Nutrition Program, [employer]. Call [phone]/ [phone].  Food pantry and assistance -Urban Ministry-Food Bank: [address], [place] [address]. Phone [phone]  -[facility] Food Pantry: [address], [place], [place] [address]. [phone].  -Missionary Ministry: has the purpose of visiting the sick and shut-ins and provide for needs in the surrounding communities. Call ([phone]. Email: [email or web address] This program provides: Food box for seniors, Financial assistance, Food to meet basic  nutritional needs.  -Meals on Wheels with Senior Resources: [facility] residents age 32 and over who are homebound and unable to obtain and prepare a nutritious meal for themselves are eligible for this service. There may be a waiting list in certain parts of [facility] if the route in that area is full. If you are in [facility] and [place] call [phone] to register. For all other areas call [phone] to register.  -Greater Dietitian: https://findfood.BargainContractor.si  TRANSPORTATION: -[employer] Department of Health: Call [facility] and [employer] at [phone] for details. AttractionGuides.es  -Access [place]: Access [place] is the [employer]'s shared-ride transportation service for eligible riders who have a disability that prevents them from riding the fixed route bus. Call [phone]. Access [place] riders must pay a fare of $1.50 per trip, or may purchase a 10-ride punch card for $14.00 ($1.40 per ride) or a 40-ride punch card for $48.00 ($1.20 per ride).  -The Shepherd's WHEELS rideshare transportation service is provided for senior citizens (60+) who live independently within [place] city limits and are unable to drive or have limited access to transportation. Call [phone] to schedule an appointment.  -[place] Transportation: For Medicare or Medicaid recipients call [phone]?[facility], wheelchair Carloyn Chi, and ambulatory quotes available.   FLEEING VIOLENCE: -Family Services of the [place]- 24/7 Crisis line (603)[phone]) -[facility] Justice Centers: (336) 641-[phone] [phone])   2-1-1 is another useful way to locate resources in the community. Visit ShedSizes.ch to find service information online. If you need additional assistance, 2-1-1 Referral Specialists are available 24 hours a day, every day by dialing  2-1-1 or [phone] from any phone. The call is free, confidential, and available in any language.  Affordable Housing Search http://www.nchousingsearch.[facility])   M-F 8a-3p [address]  [place], [place] [address] [phone] Services include: laundry, barbering, support groups, case management, phone & computer access, showers, AA/NA mtgs, mental health/substance abuse nurse, job skills class, disability information, VA assistance, spiritual classes, etc. Winter Shelter available when temperatures are less than 32 degrees.   HOMELESS SHELTERS [facility] at [facility]- Call [phone] ext. [phone]  or ext. [phone]. Located at [address], [place], [place] [address]  [facility] Mens Shelter- Call [phone]. Located at [address] [place] [address].  [facility] [employer]. Call [phone]. Office located at [address], [place] [address].  Pathways Family Housing through [place] [phone].  [facility]- Call [phone]. Located at [address] [place], [place], [place] [address].  [facility] at the Inn-For Pregnant mothers. Call [phone]. Located at [address] [place], [address].  [facility]-For men in [place]. Call [phone]. [address]-Shelter in [place]. Call [phone].  Home of [employer] for [employer] ([phone]. Office located at [address], [place], [address].  [employer] be agreeable to help with chores. Call [phone] ext. [phone].  Men's: [address] [place], [place] [address]. Women's: [facility]  [address] [place], [place] [address]  Crisis Services [facility]rapeutic Alternatives Mobile Crisis Management- [phone]  [facility] [address], [place], [place] [address]. Phone: [phone] Rent/Utility Assistance in  [facility]:  INNOVATIVE PATHWAYS [address] [place], [place] [address] [phone] Mon 8:00am - 6:00pm; Tue 8:00am - 6:00pm; Wed 8:00am - 6:00pm; Thu 8:00am - 6:00pm; Fri 8:00am - 6:00pm; Email: [email or web address] Eligibility: Residents of [place], [place], [place], [place], [place] and [place] that meet income limits. Call or text for eligibility screening.   [facility] MINISTRY [address] [place], [place], [place] [address] [phone] (Main: Rental Assistance) [phone] (Main: Utility Assistance) Mon 8:30am - 5:00pm; Tue 8:30am - 5:00pm; Wed 8:30am - 5:00pm; Thu 8:30am - 5:00pm; Fri 8:30am - 5:00pm; Website: http://www.greensborourbanministry.org/emergency-assistance-program Eligibility: People who have an unexpected crisis or emergency that can be verified. Must have some form of income and meet income limits. At the first of the month, only helps with rent/mortgage assistance for those who have court ordered eviction notices. Call for application information. Call for exact documents that will be needed. Examples of documents that may be needed: Photo ID, Social Security cards for everyone in the household, and proof of income for previous 2 months. Copy of eviction notice for rent assistance and copy of final notice for utility assistance. Statements or receipts of bills for previous 2 months.   [facility] - [place] [address] [place], [place] [address] [phone] (Main) [phone] (Alternate) Mon 9:00am - 5:00pm; Tue 9:00am - 5:00pm; Wed 9:00am - 5:00pm; Thu 9:00am - 5:00pm; Fri 9:00am - 5:00pm; Website: http://[email or web address].salvationarmy.org/[place]/emergency-financial-assistance Email: [email or web address] Eligibility: People experiencing a housing crisis with past-due rent and/or utilities and meet income limits. Must be willing to take part in 6 Call or visit website to download  application. Return complete application by mail or email only. Documents: Help with Utilities: Photo ID, proof of household income, copies of monthly bills or receipts, and a final disconnection/shut-off notice. Help with Rent or Mortgage: Photo ID, proof of income, copies of monthly bills or receipts, and eviction notice. Help with Household Goods: Photo ID, proof of household income, copies of monthly bills or receipts, and a fire or flood report.  [facility] [place] [address] [place], [place] [address] [phone] (Main) Mon 8:00am - 5:00pm; Tue 8:00am - 5:00pm; Wed 8:00am - 5:00pm; Thu 8:00am - 5:00pm; Fri 8:00am - 12:00pm; Website: http://southernusa.salvationarmy.org/[place]/emergency-financial-assistance Email: [email or web address] Call for eligibility information. Apply :Utilities Assistance: Visit office by 8:30am on 1st and 4th Monday of each  month to pick up application. Rent and Mortgage Assistance: Visit office by 8:30am on 2nd and 3rd Monday of each month to pick up application. NOTE: If Monday falls on a holiday applications can be picked up the following Tuesday. Documents required will be listed on application.  [facility] [phone] (Main) Seen by appointment only. Call for more information. Eligibility: Meet income limits. Apply: Call for information on how to schedule an appointment. Each month there is a specific day to call to schedule an appointment. It is stated on the agency voicemail message. Appointments fill up quickly each month. Documents: Photo ID, copy of current utility bill.  [facility]S [place] [address], [place], [place] [address] [phone] (Main) Tue 9:00am - 4:00pm; Wed 9:00am - 4:00pm; Thu 9:00am - 4:00pm; Website: http://www.[email or web address] Email: helpinghandsclientassistance@gmail [email or web address] Eligibility: Utility Assistance: Meet income limits and be a Haematologist. [employer] customers do not qualify. Must not have received utility assistance for another agency within the last 90 days. Rent Assistance: Residents of [place] who meet income limits. Must not have received rent assistance for another agency within the last 90 days. Apply: Call to schedule an appointment. Documents: Utility Assistance: Photo ID, City of [employer], copy of lease (if not paying a mortgage), proof of income, and monthly expenses. Rent Assistance: Photo ID, W-9 from the landlord, copy of the lease, proof of income, and a list of monthly expenses.  [facility] - [place] [address], [place], [place] [address] [phone] (Main: Help With Rent) [phone] (Main: Help With Utilities) Mon 9:00am - 4:00pm; Tue 9:00am - 4:00pm; Wed 9:00am - 4:00pm; Thu 9:00am - 4:00pm; Fri 9:00am - 4:00pm; Website: MotivationalSites.no Email: [email or web address] Eligibility: People experiencing a financial crisis. Apply: Call to schedule an appointment Wednesday, 7:30am. Documents: Photo ID, Social Security card, proof of income, and proof of address. Other documents may be required, depending on service. Call for more information.  LOW INCOME ENERGY ASSISTANCE PROGRAM DEPARTMENT OF SOCIAL SERVICES - [facility] [address], [place], [place] [address] [phone] (Main) Mon 8:00am - 5:00pm; Tue 8:00am - 5:00pm; Wed 8:00am - 5:00pm; Thu 8:00am - 5:00pm; Fri 8:00am - 5:00pm; Website: [email or web address] Eligibility: Meet income limits and resource guidelines. Each household is only eligible once, even if multiple members apply. Apply: Call to see if funds are available. Visit to complete an application, call to have 1 mailed, or apply online at [email or web address] NOTE: Households with a person age 84  and over or a person with a documented disability can apply beginning December 1. Other households can apply beginning January 1. Documents: Photo ID, birth certificate, proof of household income, copy of utility bill, latest bank statement, the names and Social Security numbers for everyone in the household, and proof of disability if under age 52.  LOW INCOME ENERGY ASSISTANCE PROGRAM DEPARTMENT OF SOCIAL SERVICES - [facility] [place] [address] [place], [place], [place] [address] [phone] (Main) Mon 8:00am - 5:00pm; Tue 8:00am - 5:00pm; Wed 8:00am - 5:00pm; Thu 8:00am - 5:00pm; Fri 8:00am - 5:00pm; Website: [email or web address] Eligibility: Meet income limits and resource guidelines. Each household is only eligible once, even if multiple members apply. Apply: Call to see if funds are available. Visit to complete an application, call to have 1 mailed, or apply online at [email or web address] NOTE: Households with a person age 34 and over or a person with a documented  disability can apply beginning December 1. Other households can apply beginning January 1. Documents: Photo ID, birth certificate, proof of household income, copy of utility bill, latest bank statement, the names and Social Security numbers for everyone in the household, and proof of disability if under age 53.  Homeless Shelter List:  Health and safety inspector [facility] [place]) [address] [place], [place]  Phone: [phone]  [facility] [address] [place], [place] [address] Phone: [phone]  [facility] (Women only) [address] [place], [place] [address] Phone: [phone]  [facility] [address], [place] [address] Phone: [phone]  [facility]: [phone]. [address] [place], [place] [address] Phone: [phone]  [facility] Overflow  Shelter  [address] [place], [place] [address] (Check in at 6:00PM for placement at a local shelter) Phone: [phone]

## 2023-06-09 NOTE — Discharge Summary (Signed)
 Name: Lawrence Mccormick MRN: [id number] DOB: 04/23/1980 43 y.o. PCP: Paseda, Folashade R, FNP  Date of Admission: 06/07/2023  5:55 AM Date of Discharge:  06/09/23 Attending Physician: Dr. Bettejane Brownie  DISCHARGE DIAGNOSIS:  Primary Problem: Cellulitis and abscess of left lower extremity   Hospital Problems: Principal Problem:   Cellulitis and abscess of left lower extremity Active Problems:   Type 2 diabetes mellitus with hyperglycemia, without long-term current use of insulin  (HCC)    DISCHARGE MEDICATIONS:   Allergies as of 06/09/2023   No Known Allergies      Medication List     STOP taking these medications    doxycycline  100 MG capsule Commonly known as: VIBRAMYCIN  Replaced by: doxycycline  100 MG tablet       TAKE these medications    Accu-Chek Softclix Lancets lancets Use in the morning, at noon, and at bedtime.   acetaminophen  500 MG tablet Commonly known as: TYLENOL  Take 2 tablets (1,000 mg total) by mouth every 8 (eight) hours. What changed:  how much to take when to take this reasons to take this   amoxicillin-clavulanate 875-125 MG tablet Commonly known as: AUGMENTIN Take 1 tablet by mouth every 12 (twelve) hours. Start on the evening of 5/12.   Blood Glucose Monitor System w/Device Kit Use in the morning, at noon, and at bedtime.   BLOOD GLUCOSE TEST STRIPS Strp Use in the morning, at noon, and at bedtime.   doxycycline  100 MG tablet Commonly known as: VIBRA -TABS Take 1 tablet (100 mg total) by mouth every 12 (twelve) hours. Start on the evening of 5/12. Replaces: doxycycline  100 MG capsule   FreeStyle Libre 3 Plus Sensor Misc Change sensor every 15 days.   ibuprofen  600 MG tablet Commonly known as: ADVIL  Take 1 tablet (600 mg total) by mouth every 6 (six) hours as needed for up to 7 days for mild pain (pain score 1-3) or moderate pain (pain score 4-6).   Lancet Device Misc 1 each by Does not apply route in the morning, at noon, and at  bedtime. May substitute to any manufacturer covered by patient's insurance.   Lantus SoloStar 100 UNIT/ML Solostar Pen Generic drug: insulin  glargine Inject 14 Units into the skin daily. Start taking on: Jun 10, 2023   metFORMIN  500 MG tablet Commonly known as: GLUCOPHAGE  Take 1 tablet (500 mg total) by mouth 2 (two) times daily with a meal.   oxyCODONE  5 MG immediate release tablet Commonly known as: Oxy IR/ROXICODONE  Take 1 tablet (5 mg total) by mouth every 8 (eight) hours as needed for severe pain (pain score 7-10).   Pen Needles 32G X 4 MM Misc Use to inject insulin  once daily.   senna 8.6 MG Tabs tablet Commonly known as: SENOKOT Take 1 tablet (8.6 mg total) by mouth at bedtime as needed for mild constipation.               Durable Medical Equipment  (From admission, onward)           Start     Ordered   06/09/23 1247  For home use only DME Walker rolling  Once       Question Answer Comment  Walker: With 5 Inch Wheels   Patient needs a walker to treat with the following condition Debility      06/09/23 1247            DISPOSITION AND FOLLOW-UP:  [name] was discharged from [facility] in  stable condition. At the hospital follow up visit please address:  He was hospitalized on Cellulitis of the LLE near a chronic leg wound. Initial IV medicines Zosyn/Vancomycin changed to Doxycycline /Augmentin on day 3 for 7 day course to end 5/16. While here, no systemic symptoms including fever or trending leukocytosis.  We worked on his diabetes too. His A1c is above 12 and so started insulin . Never on insulin  before. Prior, he was on no medicines, but was given metformin  two days before hospitalization.  Please evaluate the cellulitis on the LLE. Consider CBC. Evaluate for pain. Evaluate diabetes.  Follow-up Recommendations: Labs: CBC, CMP Medications: Doxycycline  and Augmentin BID to end 5/16, supply of tylenol , ibuprofen , oxycodone   5mg  given for short term pain control. Started on insulin  14u daily. Continued metformin  500 BID, can likely incrase to 1000BID pending tolerance.  Follow-up Appointments: 5/16 at 1:20 to establish care with Ms Paseda. Thank you for accomodation with earlier appointment.  HOSPITAL COURSE:  Patient Summary: Cellulitis and abscess of LLE S/P I&D of abscess Admitted with pain and rash two nights after starting doxycycline  outpatient. Admitted given his severe diabetes which could limit treatment as well as presence of abscess at L leg. Small abscess drained on anterior leg without complications. Treated with two days of vancomycin and zosyn, then switched to oral antibiotics - doxycycline  and augment - with instructions to complete a 7-day course. No leukocytosis or fevers. PT evaluation with recommendation for walker at discharge but otherwise functionally well. Will send him with pain regimen of tylenol  and ibuprofen  and a short supply of 5mg  oxycodone  every 8 hours with a bowel regimen. He is to establish with new PCP on 5/16.   T2DM A1c above 12 without treatment. Was started on metformin  at urgent care days before hospitalization. Given severe disease, we have started insulin  at 14u daily and provided education on insulin  use and monitoring, including Libre CGM. Will also continue metformin  500 BID. Our diabetes educators discussed safe insulin  use and sugar monitoring. - Recommend maximizing dose of metformin  as tolerated at future clinic visits and adjustment of insulin  as needed.    DISCHARGE INSTRUCTIONS:   Discharge Instructions     Diet - low sodium heart healthy   Complete by: As directed    Discharge instructions   Complete by: As directed    We will send you home with antibiotics. Start them this evening. Every morning and evening, take one pill each of Augmentin and Doxycycline  until out. The last day of medicine will be Friday.  For pain, use tylenol  and ibuprofen . Some have  been provided. Space the doses out according to the instructions. For better results, do not take both medicines together, but alternate instead - taking tylenol  or ibuprofen  every four hours. For severe pain, I have given a small supply of oxycodone . Do not take more than once every eight hours.  For your diabetes, you will start insulin . Inject insulin  to the abdomen once daily, 14 units. Do this starting Tuesday. Do not use more than once in a day. Use your glucometer to monitor you rblood sugars. Insulin  lowers your blood sugar and too much can be dangerous, and symptoms are dizziness and feeling like you will faint. If you feel ill, check your sugar and drink a sugary beverage. Blood sugar should not go below 70.  We have arranged your hospital follow up with your new doctor. It is on 5/16 at 1:20 pm. Contact information is: [facility] [address] [place], [place] [address] [phone]   Increase activity slowly   Complete by: As directed        SUBJECTIVE:   Overall feels improved but still some pain with ambulation. No fevers or chills. Reports swelling is reduced.  Discharge Vitals:   BP (!) 142/96 (BP Location: Left Arm)   Pulse 80   Temp 98.9 F (37.2 C) (Oral)   Resp 17   Ht 5\' 9"  (1.753 m)   Wt 65.8 kg   SpO2 100%   BMI 21.42 kg/m   OBJECTIVE:  Physical Exam Constitutional:      General: He is not in acute distress.    Appearance: He is not ill-appearing.  Cardiovascular:     Rate and Rhythm: Normal rate and regular rhythm.     Pulses: Normal pulses.  Pulmonary:     Effort: Pulmonary effort is normal.     Breath sounds: Normal breath sounds.  Abdominal:     General: Abdomen is flat. Bowel sounds are normal.     Tenderness: There is no abdominal tenderness.  Musculoskeletal:        General: Tenderness present.     Right lower leg: No edema.     Left lower leg: Edema present.     Comments: Residual warmth, swelling, and tenderness  of the LLE that has improved since admission  Skin:    General: Skin is warm and dry.  Neurological:     General: No focal deficit present.     Mental Status: He is alert.  Psychiatric:        Mood and Affect: Mood normal.        Behavior: Behavior normal.     Pertinent Labs, Studies, and Procedures:     Latest Ref Rng & Units 06/09/2023    8:13 AM 06/08/2023    4:27 AM 06/07/2023    6:15 AM  CBC  WBC 4.0 - 10.5 K/uL 9.5  10.7  8.6   Hemoglobin 13.0 - 17.0 g/dL 21.3  08.6  57.8   Hematocrit 39.0 - 52.0 % 36.0  42.4  38.8   Platelets 150 - 400 K/uL 232  251  236        Latest Ref Rng & Units 06/09/2023    8:13 AM 06/08/2023    4:27 AM 06/07/2023    6:15 AM  CMP  Glucose 70 - 99 mg/dL 469  629  528   BUN 6 - 20 mg/dL 10  15  12    Creatinine 0.61 - 1.24 mg/dL 4.13  2.44  0.10   Sodium 135 - 145 mmol/L 132  135  133   Potassium 3.5 - 5.1 mmol/L 3.5  4.4  3.9   Chloride 98 - 111 mmol/L 95  98  95   CO2 22 - 32 mmol/L 27  25  27    Calcium 8.9 - 10.3 mg/dL 8.7  9.5  9.3     MR TIBIA FIBULA LEFT W WO CONTRAST Result Date: 06/07/2023 CLINICAL DATA:  Left lower extremity soft tissue wound EXAM: MRI OF LOWER LEFT EXTREMITY WITHOUT AND WITH CONTRAST TECHNIQUE: Multiplanar, multisequence MR imaging of the left lower leg was performed both before and after administration of intravenous contrast. CONTRAST:  6.5mL GADAVIST  GADOBUTROL  1 MMOL/ML IV SOLN COMPARISON:  X-ray 06/07/2023 FINDINGS: Bones/Joint/Cartilage Left tibia and fibula are intact. No fracture or dislocation. No erosion. No bone marrow edema or marrow replacement. No bone lesion. Ligaments No acute ligamentous abnormality is evident  at the edges of the field of view. Muscles and Tendons Mild intramuscular edema within the tibialis anterior muscle approximating the overlying fluid collection suggestive of a reactive myositis. No intramuscular fluid collection. No muscle atrophy. Imaged tendinous structures are intact. Soft tissues  Soft tissue swelling and edema most pronounced at the anterior aspect of the lower leg. Within the subcutaneous soft tissues anterior to the mid left tibial diaphysis is a well-defined rim enhancing fluid collection measuring 3.0 x 0.8 x 2.2 cm. There appears to be a wound overlying the site of collection. Soft tissue edema extends distally to the level of the ankle/hindfoot suggesting cellulitis. No additional fluid collections. No deep fascial fluid or edema. IMPRESSION: 1. Soft tissue swelling and edema most pronounced at the anterior aspect of the lower leg compatible with cellulitis. Within the subcutaneous soft tissues anterior to the mid left tibial diaphysis is a well-defined rim-enhancing fluid collection measuring 3.0 x 0.8 x 2.2 cm, compatible with abscess. 2. Mild intramuscular edema within the tibialis anterior muscle approximating the overlying fluid collection suggestive of a reactive myositis. 3. No evidence of osteomyelitis. Electronically Signed   By: Leverne Reading D.O.   On: 06/07/2023 10:34   DG Tibia/Fibula Left Result Date: 06/07/2023 CLINICAL DATA:  Mid anterior tibial soft tissue wound, lower extremity pain EXAM: LEFT TIBIA AND FIBULA - 2 VIEW COMPARISON:  06/05/2023 FINDINGS: Intact left tibia and fibula. No acute osseous finding or fracture. No cortical abnormality. On the lateral view, there is slight similar anterior tibial soft tissue prominence/swelling. No underlying radiopaque foreign body. IMPRESSION: Anterior tibial soft tissue swelling. No acute osseous finding. Electronically Signed   By: Melven Stable.  Shick M.D.   On: 06/07/2023 08:47     Signed: Carleen Chary, DO Internal Medicine Resident, PGY-1 [facility]  3:03 PM, 06/09/2023

## 2023-06-09 NOTE — TOC CM/SW Note (Signed)
 Transition of Care [facility]) - Inpatient Brief Assessment   Patient Details  Name: Lawrence Mccormick MRN: [id number] Date of Birth: Jun 11, 1980  Transition of [facility]) CM/SW Contact:    Juliane Och, LCSW Phone Number: 06/09/2023, 12:55 PM   Clinical Narrative:  12:56 PM CSW provided patient with SDOH resources (housing).  Transition of Care Asessment: Insurance and Status: Insurance coverage has been reviewed Patient has primary care physician: Yes Home environment has been reviewed: Private Residence Prior level of function:: Needs Assistance Prior/Current Home Services: No current home services (Has DME history) Social Drivers of Health Review: SDOH reviewed interventions complete Readmission risk has been reviewed: Yes Transition of care needs: transition of care needs identified, TOC will continue to follow (TOC needs completed)

## 2023-06-09 NOTE — Telephone Encounter (Signed)
 Pharmacy Patient Advocate Encounter  Received notification from [facility] that Prior Authorization for FreeStyle Libre 3 Plus Sensor  has been APPROVED from 06/09/2023 to 12/10/2023. Ran test claim, Copay is $0.00. This test claim was processed through [facility]- copay amounts may vary at other pharmacies due to pharmacy/plan contracts, or as the patient moves through the different stages of their insurance plan.   PA #/Case ID/Reference #: [id number]

## 2023-06-09 NOTE — Evaluation (Signed)
 Physical Therapy Evaluation  Patient Details Name: Lawrence Mccormick MRN: [id number] DOB: January 23, 1981 Today's Date: 06/09/2023  History of Present Illness  Pt is a 43 y/o male who presents 06/07/2023 with LLE pain around chronic wound site. He was admitted with cellulitis and abscess. I&D performed bedside on 5/10 as well. PMH significant for uncontrolled DM.  Clinical Impression  Pt admitted with above diagnosis. Pt currently with functional limitations due to the deficits listed below (see PT Problem List). At the time of PT eval pt was able to perform transfers with modified independence/independence and short distance ambulation in room with supervision for safety and assist for management of IV pole. Pt limited to ~12 feet ambulation distance in room due to pain. Functionally, feel pt is safe for d/c from a PT perspective, however pt reporting high levels of pain after mobility attempts. Pt states he can pull up very close to the entrance of the apartment, and reports he feels he will be able to enter apartment and manage with the use of a RW. Pt will benefit from acute skilled PT to increase their independence and safety with mobility to allow discharge.           If plan is discharge home, recommend the following: A little help with walking and/or transfers;Assistance with cooking/housework;Assist for transportation   Can travel by private vehicle        Equipment Recommendations Rolling walker (2 wheels)  Recommendations for Other Services       Functional Status Assessment Patient has had a recent decline in their functional status and demonstrates the ability to make significant improvements in function in a reasonable and predictable amount of time.     Precautions / Restrictions Precautions Precautions: None Recall of Precautions/Restrictions: Intact Restrictions Weight Bearing Restrictions Per Provider Order: No      Mobility  Bed Mobility Overal bed mobility:  Independent, Modified Independent             General bed mobility comments: Increased time but no assist required for pt to transition to/from EOB.    Transfers Overall transfer level: Modified independent Equipment used: Rolling walker (2 wheels), Crutches Transfers: Sit to/from Stand             General transfer comment: No assist required. Significantly increased pain initially with LE in dependent position, and then again with attempts at weight bearing. Slow to rise, guarded and anticipating pain throughout transfers.    Ambulation/Gait Ambulation/Gait assistance: Supervision Gait Distance (Feet): 12 Feet Assistive device: Crutches, Rolling walker (2 wheels) Gait Pattern/deviations: Step-to pattern, Decreased stance time - left, Decreased weight shift to left, Trunk flexed Gait velocity: Decreased Gait velocity interpretation: <1.31 ft/sec, indicative of household ambulator   General Gait Details: Initially with crutches, and pt resting onto axillary pads of crutches. Unable to achieve improved posture due to pain and wanting to rest down onto crutches. Attempted with RW and pt able to maintain a more upright posture. Pt taking several rest breaks due to pain and crying due to pain. No assist required. Pt able to ambulate around the bed to sit down on the other side but unable to tolerate any more activity.  Stairs Stairs:  (Pt states he does not have stairs at home.)          Wheelchair Mobility     Tilt Bed    Modified Rankin (Stroke Patients Only)       Balance  Pertinent Vitals/Pain Pain Assessment Pain Assessment: 0-10 Pain Score: 8  Pain Location: L shin Pain Descriptors / Indicators: Sharp Pain Intervention(s): Limited activity within patient's tolerance, Monitored during session, Repositioned, Ice applied    Home Living Family/patient expects to be discharged to:: Private  residence Living Arrangements: Parent Available Help at Discharge: Family;Available PRN/intermittently Type of Home: Apartment Home Access: Level entry       Home Layout: One level Home Equipment: Shower seat      Prior Function Prior Level of Function : Independent/Modified Independent             Mobility Comments: Pt states the wound on his leg has been there since he was 43 years old. Pt states the pain suddenly got worse over the last few weeks.       Extremity/Trunk Assessment   Upper Extremity Assessment Upper Extremity Assessment: Overall WFL for tasks assessed    Lower Extremity Assessment Lower Extremity Assessment: LLE deficits/detail LLE Deficits / Details: Decreased tolerance for activity due to acute pain. LLE: Unable to fully assess due to pain LLE Sensation:  (hypersensitive to light touch)    Cervical / Trunk Assessment Cervical / Trunk Assessment: Normal  Communication   Communication Communication: No apparent difficulties    Cognition Arousal: Alert Behavior During Therapy: WFL for tasks assessed/performed   PT - Cognitive impairments: No apparent impairments                         Following commands: Intact       Cueing Cueing Techniques: Verbal cues, Gestural cues     General Comments      Exercises     Assessment/Plan    PT Assessment Patient needs continued PT services  PT Problem List Decreased activity tolerance;Pain;Decreased mobility;Decreased knowledge of use of DME;Decreased safety awareness       PT Treatment Interventions DME instruction;Gait training;Stair training;Functional mobility training;Therapeutic activities;Therapeutic exercise;Balance training;Patient/family education    PT Goals (Current goals can be found in the Care Plan section)  Acute Rehab PT Goals Patient Stated Goal: Decrease pain PT Goal Formulation: With patient Time For Goal Achievement: 06/16/23 Potential to Achieve Goals:  Good    Frequency Min 1X/week     Co-evaluation               AM-PAC PT "6 Clicks" Mobility  Outcome Measure Help needed turning from your back to your side while in a flat bed without using bedrails?: None Help needed moving from lying on your back to sitting on the side of a flat bed without using bedrails?: None Help needed moving to and from a bed to a chair (including a wheelchair)?: None Help needed standing up from a chair using your arms (e.g., wheelchair or bedside chair)?: None Help needed to walk in hospital room?: Total (Unable to make it 20' due to pain) Help needed climbing 3-5 steps with a railing? : Total 6 Click Score: 18    End of Session   Activity Tolerance: Patient limited by pain Patient left: in bed;with call bell/phone within reach Nurse Communication: Mobility status;Other (comment) (Increased pain) PT Visit Diagnosis: Pain;Other abnormalities of gait and mobility (R26.89) Pain - Right/Left: Left Pain - part of body: Leg    Time: 1610-9604 PT Time Calculation (min) (ACUTE ONLY): 37 min   Charges:   PT Evaluation $PT Eval Low Complexity: 1 Low PT Treatments $Gait Training: 8-22 mins PT General Charges $$ ACUTE PT VISIT: 1  Visit         Simone Dubois, PT, DPT Acute Rehabilitation Services Secure Chat Preferred Office: [phone]   Venus Ginsberg 06/09/2023, 1:49 PM

## 2023-06-09 NOTE — TOC Transition Note (Signed)
 Transition of Care [facility] - Discharge Note   Patient Details  Name: Lawrence Mccormick MRN: [id number] Date of Birth: 11-06-1980  Transition of Care [facility]) CM/SW Contact:  Jeani Mill, RN Phone Number: 06/09/2023, 12:52 PM   Clinical Narrative:    Patient stable for discharge.  Patient agreeable to walker.  PCP apt on AVS. Patient states he will find transportation home.     Final next level of care: Home/Self Care Barriers to Discharge: Barriers Resolved   Patient Goals and CMS Choice Patient states their goals for this hospitalization and ongoing recovery are:: Return Home          Discharge Placement               Home        Discharge Plan and Services Additional resources added to the After Visit Summary for                  DME Arranged: Walker rolling DME Agency: [employer] Date DME Agency Contacted: 06/09/23 Time DME Agency Contacted: 1251 Representative spoke with at DME Agency: [facility]ack HH Arranged: NA          Social Drivers of Health (SDOH) Interventions SDOH Screenings   Food Insecurity: No Food Insecurity (06/07/2023)  Housing: High Risk (06/07/2023)  Transportation Needs: No Transportation Needs (06/07/2023)  Utilities: Not At Risk (06/07/2023)  Tobacco Use: High Risk (06/07/2023)     Readmission Risk Interventions     No data to display

## 2023-06-09 NOTE — Hospital Course (Addendum)
 Cellulitis and abscess of LLE S/P I&D of abscess Admitted with pain and rash two nights after starting doxycycline  outpatient. Admitted given his severe diabetes which could limit treatment as well as presence of abscess at L leg. Small abscess drained on anterior leg without complications. Treated with two days of vancomycin and zosyn, then switched to oral antibiotics - doxycycline  and augment - with instructions to complete a 7-day course. No leukocytosis or fevers. PT evaluation with recommendation for walker at discharge but otherwise functionally well. Will send him with pain regimen of tylenol  and ibuprofen  and a short supply of 5mg  oxycodone  every 8 hours with a bowel regimen. He is to establish with new PCP on 5/16.   T2DM A1c above 12 without treatment. Was started on metformin  at urgent care days before hospitalization. Given severe disease, we have started insulin  at 14u daily and provided education on insulin  use and monitoring, including Libre CGM. Will also continue metformin  500 BID. Our diabetes educators discussed safe insulin  use and sugar monitoring. - Recommend maximizing dose of metformin  as tolerated at future clinic visits and adjustment of insulin  as needed.

## 2023-06-09 NOTE — Inpatient Diabetes Management (Signed)
 Inpatient Diabetes Program Recommendations  AACE/ADA: New Consensus Statement on Inpatient Glycemic Control (2015)  Target Ranges:  Prepandial:   less than 140 mg/dL      Peak postprandial:   less than 180 mg/dL (1-2 hours)      Critically ill patients:  140 - 180 mg/dL   Lab Results  Component Value Date   GLUCAP 159 (H) 06/09/2023   HGBA1C 12.7 (H) 06/08/2023    Review of Glycemic Control  Diabetes history: DM 2 Outpatient Diabetes medications: Metformin  500 mg bid pt reports just starting it back 2 days ago Current orders for Inpatient glycemic control:  Semglee 14 units Daily Novolog  0-9 units tid + hs  Discharge Recommendations: Other recommendations: Freestyle Libre 3 plus Sensor order # [id number], Metformin  500 mg bid Long acting recommendations: Insulin  Glargine (LANTUS) Solostar Pen 14 units Daily  Supply/Referral recommendations: Glucometer Test strips Lancet device Lancets Pen needles - standard   Use Adult Diabetes Insulin  Treatment Post Discharge order set.   NOTE: waiting on prior Authorization on Freestyle Libre 3 Plus  Spoke with pt at bedside regarding glucose levels and importance of glucose control. Informed the pt about the need for insulin  at time of discharge. Discussed insulin  and when to given it. Educated patient on insulin  pen use at home.  Reviewed all steps if insulin  pen including attachment of needle, 2-unit air shot, dialing up dose, giving injection, removing needle, disposal of sharps, storage of unused insulin , disposal of insulin  etc. Patient able to provide successful return demonstration. Also reviewed troubleshooting with insulin  pen.   Thanks,  Eloise Hake RN, MSN, BC-ADM Inpatient Diabetes Coordinator Team Pager [phone] (8a-5p)

## 2023-06-13 ENCOUNTER — Encounter: Payer: Self-pay | Admitting: Nurse Practitioner

## 2023-06-13 ENCOUNTER — Ambulatory Visit: Payer: Self-pay | Admitting: Nurse Practitioner

## 2023-06-13 VITALS — BP 123/80 | HR 81 | Temp 97.0°F | Wt 172.0 lb

## 2023-06-13 DIAGNOSIS — L03116 Cellulitis of left lower limb: Secondary | ICD-10-CM | POA: Diagnosis not present

## 2023-06-13 DIAGNOSIS — L02416 Cutaneous abscess of left lower limb: Secondary | ICD-10-CM

## 2023-06-13 DIAGNOSIS — F17200 Nicotine dependence, unspecified, uncomplicated: Secondary | ICD-10-CM | POA: Diagnosis not present

## 2023-06-13 DIAGNOSIS — E1165 Type 2 diabetes mellitus with hyperglycemia: Secondary | ICD-10-CM | POA: Diagnosis not present

## 2023-06-13 DIAGNOSIS — Z09 Encounter for follow-up examination after completed treatment for conditions other than malignant neoplasm: Secondary | ICD-10-CM | POA: Insufficient documentation

## 2023-06-13 DIAGNOSIS — Z7189 Other specified counseling: Secondary | ICD-10-CM

## 2023-06-13 MED ORDER — METFORMIN HCL 500 MG PO TABS
500.0000 mg | ORAL_TABLET | Freq: Two times a day (BID) | ORAL | 2 refills | Status: AC
  Filled 2023-07-12: qty 60, 30d supply, fill #0

## 2023-06-13 MED ORDER — AMOXICILLIN-POT CLAVULANATE 875-125 MG PO TABS
1.0000 | ORAL_TABLET | Freq: Two times a day (BID) | ORAL | 0 refills | Status: AC
Start: 2023-06-13 — End: ?

## 2023-06-13 MED ORDER — DOXYCYCLINE HYCLATE 100 MG PO TABS
100.0000 mg | ORAL_TABLET | Freq: Two times a day (BID) | ORAL | 0 refills | Status: AC
Start: 2023-06-13 — End: 2023-06-20

## 2023-06-13 NOTE — Assessment & Plan Note (Signed)
Smokes about 1 pack/day  Asked about quitting: confirms that he/she currently smokes cigarettes Advise to quit smoking: Educated about QUITTING to reduce the risk of cancer, cardio and cerebrovascular disease. Assess willingness: Unwilling to quit at this time, not working on cutting back. Assist with counseling and pharmacotherapy: Counseled for 5 minutes and literature provided. Arrange for follow up: follow up in 3 months and continue to offer help.

## 2023-06-13 NOTE — Progress Notes (Signed)
 New Patient Office Visit  Subjective:  Patient ID: Lawrence Mccormick, male    DOB: 1980-06-19  Age: 43 y.o. MRN: [id number]  CC:  Chief Complaint  Patient presents with   Establish Care   Wound Check    Left leg    HPI Lawrence Mccormick is a 43 y.o. 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  Patient presented establish care for his chronic medical conditions and for hospital discharge follow-up  Patient was on admission at the hospital from 06/07/2023 to 06/09/2023 for cellulitis and abscess of left lower extremity, type 2 diabetes  Cellulitis and abscess of left lower extremity.  He received Zosyn/vancomycin while in the hospital  started on doxycycline  and Augmentin which was continued after discharge(for total of 7 days).  Denies fever, chills   Uncontrolled type 2 diabetes.  Patient was started on insulin  glargine 14 units daily and metformin  500 mg twice daily, has a CGM in place.  He is not on a statin he denies polyuria polyphagia polydipsia   Tobacco use disorder.  Started smoking cigarettes at age 61, smokes 1 pack of cigarettes daily.  He denies cough, shortness of breath, wheezing  States that he is doing much better, using a walker at home, used a cane to the office today      Past Medical History:  Diagnosis Date   Diabetes mellitus without complication (HCC)    Pancreatitis 07/02/2020   Severe recurrent major depression without psychotic features (HCC) 08/13/2013   Suicidal ideations 08/13/2013   Tobacco use disorder 06/13/2023    History reviewed. No pertinent surgical history.  History reviewed. No pertinent family history.  Social History   Socioeconomic History   Marital status: Single    Spouse name: Not on file   Number of children:  Not on file   Years of education: Not on file   Highest education level: Not on file  Occupational History   Not on file  Tobacco Use   Smoking status: Every Day    Current packs/day: 0.25    Average packs/day: 0.3 packs/day for 15.0 years (3.8 ttl pk-yrs)    Types: Cigarettes   Smokeless tobacco: Not on file  Vaping Use   Vaping status: Former   Substances: Nicotine, Flavoring  Substance and Sexual Activity   Alcohol use: Yes    Comment: occ   Drug use: Yes    Types: Marijuana   Sexual activity: Yes  Other Topics Concern   Not on file  Social History Narrative   Lives with his mother    Social Drivers of Health   Financial Resource Strain: Not on file  Food Insecurity: Food Insecurity Present (06/13/2023)   Hunger Vital Sign    Worried About Running Out of Food in the Last Year: Sometimes true    Ran Out of Food in the Last Year: Sometimes true  Transportation Needs: No Transportation Needs (06/07/2023)   PRAPARE - Administrator, Civil Service (Medical): No    Lack of Transportation (Non-Medical): No  Physical Activity: Not on file  Stress: Not on file  Social Connections: Not on file  Intimate Partner Violence: Not At Risk (06/07/2023)   Humiliation, Afraid, Rape, and Kick questionnaire    Fear of Current or Ex-Partner: No    Emotionally Abused: No    Physically Abused:  No    Sexually Abused: No    ROS Review of Systems  Constitutional:  Negative for appetite change, chills, fatigue and fever.  HENT:  Negative for congestion, postnasal drip, rhinorrhea and sneezing.   Respiratory:  Negative for cough, shortness of breath and wheezing.   Cardiovascular:  Negative for chest pain, palpitations and leg swelling.  Gastrointestinal:  Negative for abdominal pain, constipation, nausea and vomiting.  Genitourinary:  Negative for difficulty urinating, dysuria, flank pain and frequency.  Musculoskeletal:  Negative for arthralgias, back pain, joint swelling and  myalgias.  Skin:  Positive for wound. Negative for color change, pallor and rash.  Neurological:  Negative for dizziness, facial asymmetry, weakness, numbness and headaches.  Psychiatric/Behavioral:  Negative for behavioral problems, confusion, self-injury and suicidal ideas.     Objective:   Today's Vitals: BP 123/80   Pulse 81   Temp (!) 97 F (36.1 C)   Wt 172 lb (78 kg)   SpO2 100%   BMI 25.40 kg/m   Physical Exam Vitals and nursing note reviewed.  Constitutional:      General: He is not in acute distress.    Appearance: Normal appearance. He is obese. He is not ill-appearing, toxic-appearing or diaphoretic.  Eyes:     General: No scleral icterus.       Right eye: No discharge.        Left eye: No discharge.     Extraocular Movements: Extraocular movements intact.     Conjunctiva/sclera: Conjunctivae normal.  Cardiovascular:     Rate and Rhythm: Normal rate and regular rhythm.     Pulses: Normal pulses.     Heart sounds: Normal heart sounds. No murmur heard.    No friction rub. No gallop.  Pulmonary:     Effort: Pulmonary effort is normal. No respiratory distress.     Breath sounds: Normal breath sounds. No stridor. No wheezing, rhonchi or rales.  Chest:     Chest wall: No tenderness.  Abdominal:     General: There is no distension.     Palpations: Abdomen is soft.     Tenderness: There is no abdominal tenderness. There is no right CVA tenderness, left CVA tenderness or guarding.  Musculoskeletal:        General: Tenderness present. No swelling, deformity or signs of injury.     Right lower leg: No edema.     Left lower leg: No edema.     Comments: Left lower leg wound with swelling and tenderness.  Skin:    General: Skin is warm and dry.     Capillary Refill: Capillary refill takes less than 2 seconds.     Coloration: Skin is not jaundiced or pale.     Findings: Erythema present. No bruising.     Comments: wound  Neurological:     Mental Status: He is alert  and oriented to person, place, and time.     Motor: No weakness.     Coordination: Coordination normal.     Gait: Gait abnormal.  Psychiatric:        Mood and Affect: Mood normal.        Behavior: Behavior normal.        Thought Content: Thought content normal.        Judgment: Judgment normal.     Assessment & Plan:   Problem List Items Addressed This Visit       Endocrine   Type 2 diabetes mellitus with hyperglycemia, without long-term current use of  insulin  (HCC)   Lab Results  Component Value Date   HGBA1C 12.7 (H) 06/08/2023    Continue Lantus 14 units daily Metformin  500 mg twice daily Patient counseled on low-carb diet, will referred patient for diabetes education Checking urine microalbumin, will check lipid panel at next visit, referral for diabetic eye exam placed      Relevant Medications   metFORMIN  (GLUCOPHAGE ) 500 MG tablet   Other Relevant Orders   Microalbumin/Creatinine Ratio, Urine   Ambulatory referral to Ophthalmology     Other   HIV Prevention    Was on Descovy  but not currently, practices MSM but not active currently We discussed restarting medication  if he becomes active        Cellulitis and abscess of left lower extremity - Primary   Will extend treatment with doxycycline  and Augmentin for another 7 days Continue Tylenol  1000 mg every 8 hours as needed and ibuprofen  600 mg every 6 hours as needed pain Encouraged to keep sites clean and dry Follow-up in 2 weeks Need to get diabetes under control discussed      Relevant Medications   amoxicillin-clavulanate (AUGMENTIN) 875-125 MG tablet   doxycycline  (VIBRA -TABS) 100 MG tablet   Tobacco use disorder   Smokes about 1 pack/day  Asked about quitting: confirms that he/she currently smokes cigarettes Advise to quit smoking: Educated about QUITTING to reduce the risk of cancer, cardio and cerebrovascular disease. Assess willingness: Unwilling to quit at this time, not  working on cutting  back. Assist with counseling and pharmacotherapy: Counseled for 5 minutes and literature provided. Arrange for follow up: follow up in 3 months and continue to offer help.       Hospital discharge follow-up   Hospital chart reviewed, including discharge summary Medications reconciled and reviewed with the patient in detail        Outpatient Encounter Medications as of 06/13/2023  Medication Sig   Accu-Chek Softclix Lancets lancets Use in the morning, at noon, and at bedtime.   acetaminophen  (TYLENOL ) 500 MG tablet Take 2 tablets (1,000 mg total) by mouth every 8 (eight) hours.   Continuous Glucose Sensor (FREESTYLE LIBRE 3 PLUS SENSOR) MISC Change sensor every 15 days.   ibuprofen  (ADVIL ) 600 MG tablet Take 1 tablet (600 mg total) by mouth every 6 (six) hours as needed for up to 7 days for mild pain (pain score 1-3) or moderate pain (pain score 4-6).   insulin  glargine (LANTUS SOLOSTAR) 100 UNIT/ML Solostar Pen Inject 14 Units into the skin daily.   Insulin  Pen Needle (PEN NEEDLES) 32G X 4 MM MISC Use to inject insulin  once daily.   oxyCODONE  (OXY IR/ROXICODONE ) 5 MG immediate release tablet Take 1 tablet (5 mg total) by mouth every 8 (eight) hours as needed for severe pain (pain score 7-10).   senna (SENOKOT) 8.6 MG TABS tablet Take 1 tablet (8.6 mg total) by mouth at bedtime as needed for mild constipation.   [DISCONTINUED] amoxicillin-clavulanate (AUGMENTIN) 875-125 MG tablet Take 1 tablet by mouth every 12 (twelve) hours. Start on the evening of 5/12.   [DISCONTINUED] doxycycline  (VIBRA -TABS) 100 MG tablet Take 1 tablet (100 mg total) by mouth every 12 (twelve) hours. Start on the evening of 5/12.   [DISCONTINUED] metFORMIN  (GLUCOPHAGE ) 500 MG tablet Take 1 tablet (500 mg total) by mouth 2 (two) times daily with a meal.   amoxicillin-clavulanate (AUGMENTIN) 875-125 MG tablet Take 1 tablet by mouth 2 (two) times daily. Start on the evening of 5/12.   Blood  Glucose Monitoring Suppl (BLOOD  GLUCOSE MONITOR SYSTEM) w/Device KIT Use in the morning, at noon, and at bedtime. (Patient not taking: Reported on 06/13/2023)   doxycycline  (VIBRA -TABS) 100 MG tablet Take 1 tablet (100 mg total) by mouth every 12 (twelve) hours for 7 days. Start on the evening of 5/12.   Glucose Blood (BLOOD GLUCOSE TEST STRIPS) STRP Use in the morning, at noon, and at bedtime. (Patient not taking: Reported on 06/13/2023)   Lancet Device MISC 1 each by Does not apply route in the morning, at noon, and at bedtime. May substitute to any manufacturer covered by patient's insurance. (Patient not taking: Reported on 06/13/2023)   metFORMIN  (GLUCOPHAGE ) 500 MG tablet Take 1 tablet (500 mg total) by mouth 2 (two) times daily with a meal.   No facility-administered encounter medications on file as of 06/13/2023.    Follow-up: Return in about 2 weeks (around 06/27/2023) for cellulitis.   Akiya Morr R Faizan Geraci, FNP

## 2023-06-13 NOTE — Assessment & Plan Note (Signed)
 Hospital chart reviewed, including discharge summary Medications reconciled and reviewed with the patient in detail

## 2023-06-13 NOTE — Assessment & Plan Note (Signed)
 Lab Results  Component Value Date   HGBA1C 12.7 (H) 06/08/2023    Continue Lantus 14 units daily Metformin  500 mg twice daily Patient counseled on low-carb diet, will referred patient for diabetes education Checking urine microalbumin, will check lipid panel at next visit, referral for diabetic eye exam placed

## 2023-06-13 NOTE — Assessment & Plan Note (Addendum)
 Will extend treatment with doxycycline  and Augmentin for another 7 days Continue Tylenol  1000 mg every 8 hours as needed and ibuprofen  600 mg every 6 hours as needed pain Encouraged to keep sites clean and dry Follow-up in 2 weeks Need to get diabetes under control discussed

## 2023-06-13 NOTE — Patient Instructions (Signed)
 Goal for fasting blood sugar ranges from 80 to 120 and 2 hours after any meal or at bedtime should be between 130 to 170.   1. Type 2 diabetes mellitus with hyperglycemia, without long-term current use of insulin  (HCC) (Primary)  - Microalbumin/Creatinine Ratio, Urine - Ambulatory referral to Ophthalmology  2. Cellulitis and abscess of left lower extremity  - amoxicillin-clavulanate (AUGMENTIN) 875-125 MG tablet; Take 1 tablet by mouth 2 (two) times daily. Start on the evening of 5/12.  Dispense: 14 tablet; Refill: 0 - doxycycline  (VIBRA -TABS) 100 MG tablet; Take 1 tablet (100 mg total) by mouth every 12 (twelve) hours for 7 days. Start on the evening of 5/12.  Dispense: 14 tablet; Refill: 0    It is important that you exercise regularly at least 30 minutes 5 times a week as tolerated  Think about what you will eat, plan ahead. Choose " clean, green, fresh or frozen" over canned, processed or packaged foods which are more sugary, salty and fatty. 70 to 75% of food eaten should be vegetables and fruit. Three meals at set times with snacks allowed between meals, but they must be fruit or vegetables. Aim to eat over a 12 hour period , example 7 am to 7 pm, and STOP after  your last meal of the day. Drink water,generally about 64 ounces per day, no other drink is as healthy. Fruit juice is best enjoyed in a healthy way, by EATING the fruit.  Thanks for choosing [facility] we consider it a privelige to serve you.

## 2023-06-13 NOTE — Assessment & Plan Note (Signed)
 Was on Descovy  but not currently, practices MSM but not active currently We discussed restarting medication  if he becomes active

## 2023-06-15 LAB — MICROALBUMIN / CREATININE URINE RATIO
Creatinine, Urine: 114.9 mg/dL
Microalb/Creat Ratio: 290 mg/g{creat} — ABNORMAL HIGH (ref 0–29)
Microalbumin, Urine: 333.4 ug/mL

## 2023-06-16 ENCOUNTER — Ambulatory Visit: Payer: Self-pay | Admitting: Nurse Practitioner

## 2023-06-27 ENCOUNTER — Ambulatory Visit: Payer: Self-pay | Admitting: Nurse Practitioner

## 2023-07-12 ENCOUNTER — Other Ambulatory Visit (HOSPITAL_COMMUNITY): Payer: Self-pay

## 2023-07-17 ENCOUNTER — Telehealth: Payer: Self-pay | Admitting: Nurse Practitioner

## 2023-07-17 NOTE — Telephone Encounter (Signed)
 Patient was identified as falling into the [place] Measure - Diabetes.   Patient was: Left voicemail to schedule with primary care provider.  Sent mychart msg to pt to schedule appt 6.19.25

## 2023-07-24 ENCOUNTER — Encounter (HOSPITAL_BASED_OUTPATIENT_CLINIC_OR_DEPARTMENT_OTHER): Attending: Internal Medicine | Admitting: Internal Medicine

## 2023-08-11 ENCOUNTER — Ambulatory Visit: Admitting: Dietician

## 2023-08-11 ENCOUNTER — Telehealth: Payer: Self-pay | Admitting: Nurse Practitioner

## 2023-08-11 NOTE — Telephone Encounter (Signed)
 Patient was identified as falling into the [place] Measure - Diabetes.   Patient was: Referred to Diabetes Management. Appt 08/18/23

## 2023-08-18 ENCOUNTER — Ambulatory Visit: Admitting: Dietician

## 2023-08-25 ENCOUNTER — Ambulatory Visit: Admitting: Nurse Practitioner
